# Patient Record
Sex: Female | Born: 1940 | Hispanic: Refuse to answer | Smoking: Never smoker
Health system: Southern US, Community
[De-identification: ages and names within clinical notes are randomized; demographics above are authoritative.]

## PROBLEM LIST (undated history)

## (undated) ENCOUNTER — Ambulatory Visit (HOSPITAL_COMMUNITY): Discharge status: Absent without leave | Payer: Medicare Other

## (undated) DIAGNOSIS — M8580 Other specified disorders of bone density and structure, unspecified site: Secondary | ICD-10-CM

## (undated) DIAGNOSIS — H409 Unspecified glaucoma: Secondary | ICD-10-CM

## (undated) DIAGNOSIS — T7840XA Allergy, unspecified, initial encounter: Secondary | ICD-10-CM

## (undated) DIAGNOSIS — R911 Solitary pulmonary nodule: Secondary | ICD-10-CM

## (undated) DIAGNOSIS — I82409 Acute embolism and thrombosis of unspecified deep veins of unspecified lower extremity: Secondary | ICD-10-CM

## (undated) DIAGNOSIS — L719 Rosacea, unspecified: Secondary | ICD-10-CM

## (undated) DIAGNOSIS — H269 Unspecified cataract: Secondary | ICD-10-CM

## (undated) DIAGNOSIS — A0471 Enterocolitis due to Clostridium difficile, recurrent: Secondary | ICD-10-CM

## (undated) DIAGNOSIS — H04123 Dry eye syndrome of bilateral lacrimal glands: Secondary | ICD-10-CM

## (undated) DIAGNOSIS — I700 Atherosclerosis of aorta: Secondary | ICD-10-CM

## (undated) DIAGNOSIS — A0472 Enterocolitis due to Clostridium difficile, not specified as recurrent: Secondary | ICD-10-CM

## (undated) HISTORY — DX: Other specified disorders of bone density and structure, unspecified site: M85.80

## (undated) HISTORY — DX: Enterocolitis due to Clostridium difficile, recurrent: A04.71

## (undated) HISTORY — DX: Unspecified glaucoma: H40.9

## (undated) HISTORY — DX: Solitary pulmonary nodule: R91.1

## (undated) HISTORY — DX: Atherosclerosis of aorta: I70.0

## (undated) HISTORY — DX: Unspecified cataract: H26.9

## (undated) HISTORY — PX: CATARACT EXTRACTION, BILATERAL: SHX1313

## (undated) HISTORY — PX: LYMPH NODE BIOPSY: SHX201

## (undated) HISTORY — DX: Allergy, unspecified, initial encounter: T78.40XA

## (undated) HISTORY — PX: OTHER SURGICAL HISTORY: SHX169

## (undated) HISTORY — PX: TUBAL LIGATION: SHX77

## (undated) HISTORY — DX: Dry eye syndrome of bilateral lacrimal glands: H04.123

---

## 1999-02-08 ENCOUNTER — Encounter: Admission: RE | Admit: 1999-02-08 | Discharge: 1999-02-08 | Payer: Self-pay | Admitting: Obstetrics and Gynecology

## 1999-02-08 ENCOUNTER — Encounter: Payer: Self-pay | Admitting: Obstetrics and Gynecology

## 2000-03-17 ENCOUNTER — Encounter: Admission: RE | Admit: 2000-03-17 | Discharge: 2000-03-17 | Payer: Self-pay | Admitting: Obstetrics and Gynecology

## 2000-03-17 ENCOUNTER — Encounter: Payer: Self-pay | Admitting: Obstetrics and Gynecology

## 2001-08-24 ENCOUNTER — Encounter: Admission: RE | Admit: 2001-08-24 | Discharge: 2001-08-24 | Payer: Self-pay | Admitting: Obstetrics and Gynecology

## 2001-08-24 ENCOUNTER — Encounter: Payer: Self-pay | Admitting: Obstetrics and Gynecology

## 2002-11-12 ENCOUNTER — Encounter: Payer: Self-pay | Admitting: Orthopedic Surgery

## 2002-11-12 ENCOUNTER — Encounter: Admission: RE | Admit: 2002-11-12 | Discharge: 2002-11-12 | Payer: Self-pay | Admitting: Orthopedic Surgery

## 2002-12-01 ENCOUNTER — Encounter: Admission: RE | Admit: 2002-12-01 | Discharge: 2002-12-01 | Payer: Self-pay | Admitting: Obstetrics and Gynecology

## 2002-12-01 ENCOUNTER — Encounter: Payer: Self-pay | Admitting: Obstetrics and Gynecology

## 2004-03-15 ENCOUNTER — Ambulatory Visit (HOSPITAL_COMMUNITY): Admission: RE | Admit: 2004-03-15 | Discharge: 2004-03-15 | Payer: Self-pay | Admitting: Obstetrics and Gynecology

## 2005-05-23 ENCOUNTER — Encounter: Admission: RE | Admit: 2005-05-23 | Discharge: 2005-05-23 | Payer: Self-pay | Admitting: Obstetrics and Gynecology

## 2005-08-15 ENCOUNTER — Encounter: Admission: RE | Admit: 2005-08-15 | Discharge: 2005-08-15 | Payer: Self-pay | Admitting: Orthopedic Surgery

## 2006-09-10 ENCOUNTER — Encounter: Admission: RE | Admit: 2006-09-10 | Discharge: 2006-09-10 | Payer: Self-pay | Admitting: Obstetrics and Gynecology

## 2007-09-11 ENCOUNTER — Encounter: Admission: RE | Admit: 2007-09-11 | Discharge: 2007-09-11 | Payer: Self-pay | Admitting: Obstetrics and Gynecology

## 2008-09-14 ENCOUNTER — Encounter: Admission: RE | Admit: 2008-09-14 | Discharge: 2008-09-14 | Payer: Self-pay | Admitting: Obstetrics and Gynecology

## 2009-09-21 ENCOUNTER — Encounter: Admission: RE | Admit: 2009-09-21 | Discharge: 2009-09-21 | Payer: Self-pay | Admitting: Obstetrics and Gynecology

## 2010-09-04 ENCOUNTER — Other Ambulatory Visit: Payer: Self-pay | Admitting: Obstetrics and Gynecology

## 2010-09-04 DIAGNOSIS — Z1231 Encounter for screening mammogram for malignant neoplasm of breast: Secondary | ICD-10-CM

## 2010-09-25 ENCOUNTER — Ambulatory Visit
Admission: RE | Admit: 2010-09-25 | Discharge: 2010-09-25 | Disposition: A | Discharge status: Home / Self Care | Payer: Medicare Other | Source: Ambulatory Visit | Attending: Obstetrics and Gynecology | Admitting: Obstetrics and Gynecology

## 2010-09-25 DIAGNOSIS — Z1231 Encounter for screening mammogram for malignant neoplasm of breast: Secondary | ICD-10-CM

## 2011-04-02 HISTORY — PX: EYE SURGERY: SHX253

## 2011-09-05 ENCOUNTER — Other Ambulatory Visit: Payer: Self-pay | Admitting: Obstetrics and Gynecology

## 2011-09-05 DIAGNOSIS — Z1231 Encounter for screening mammogram for malignant neoplasm of breast: Secondary | ICD-10-CM

## 2011-09-26 ENCOUNTER — Ambulatory Visit
Admission: RE | Admit: 2011-09-26 | Discharge: 2011-09-26 | Disposition: A | Discharge status: Home / Self Care | Payer: BLUE CROSS/BLUE SHIELD | Source: Ambulatory Visit | Attending: Obstetrics and Gynecology | Admitting: Obstetrics and Gynecology

## 2011-09-26 DIAGNOSIS — Z1231 Encounter for screening mammogram for malignant neoplasm of breast: Secondary | ICD-10-CM

## 2012-08-27 ENCOUNTER — Other Ambulatory Visit: Payer: Self-pay

## 2012-08-27 DIAGNOSIS — Z1231 Encounter for screening mammogram for malignant neoplasm of breast: Secondary | ICD-10-CM

## 2012-09-30 ENCOUNTER — Ambulatory Visit
Admission: RE | Admit: 2012-09-30 | Discharge: 2012-09-30 | Disposition: A | Discharge status: Home / Self Care | Payer: BLUE CROSS/BLUE SHIELD | Source: Ambulatory Visit

## 2012-09-30 DIAGNOSIS — Z1231 Encounter for screening mammogram for malignant neoplasm of breast: Secondary | ICD-10-CM

## 2013-09-09 ENCOUNTER — Other Ambulatory Visit: Payer: Self-pay

## 2013-09-09 DIAGNOSIS — Z1231 Encounter for screening mammogram for malignant neoplasm of breast: Secondary | ICD-10-CM

## 2013-10-14 ENCOUNTER — Encounter (INDEPENDENT_AMBULATORY_CARE_PROVIDER_SITE_OTHER): Payer: Self-pay

## 2013-10-14 ENCOUNTER — Ambulatory Visit
Admission: RE | Admit: 2013-10-14 | Discharge: 2013-10-14 | Disposition: A | Discharge status: Home / Self Care | Payer: Medicare Other | Source: Ambulatory Visit

## 2013-10-14 DIAGNOSIS — Z1231 Encounter for screening mammogram for malignant neoplasm of breast: Secondary | ICD-10-CM

## 2014-04-01 HISTORY — PX: CATARACT EXTRACTION: SUR2

## 2014-09-12 ENCOUNTER — Other Ambulatory Visit: Payer: Self-pay

## 2014-09-12 DIAGNOSIS — Z1231 Encounter for screening mammogram for malignant neoplasm of breast: Secondary | ICD-10-CM

## 2014-10-21 ENCOUNTER — Ambulatory Visit
Admission: RE | Admit: 2014-10-21 | Discharge: 2014-10-21 | Disposition: A | Discharge status: Home / Self Care | Payer: Medicare Other | Source: Ambulatory Visit

## 2014-10-21 DIAGNOSIS — Z1231 Encounter for screening mammogram for malignant neoplasm of breast: Secondary | ICD-10-CM

## 2015-09-13 ENCOUNTER — Encounter (HOSPITAL_COMMUNITY): Payer: Self-pay | Admitting: Emergency Medicine

## 2015-09-13 ENCOUNTER — Ambulatory Visit (HOSPITAL_COMMUNITY)
Admission: EM | Admit: 2015-09-13 | Discharge: 2015-09-13 | Disposition: A | Discharge status: Home / Self Care | Payer: Medicare Other | Attending: Emergency Medicine | Admitting: Emergency Medicine

## 2015-09-13 DIAGNOSIS — A09 Infectious gastroenteritis and colitis, unspecified: Secondary | ICD-10-CM | POA: Diagnosis not present

## 2015-09-13 DIAGNOSIS — R197 Diarrhea, unspecified: Secondary | ICD-10-CM

## 2015-09-13 HISTORY — DX: Rosacea, unspecified: L71.9

## 2015-09-13 LAB — C DIFFICILE QUICK SCREEN W PCR REFLEX
C Diff antigen: POSITIVE — AB
C Diff interpretation: POSITIVE
C Diff toxin: POSITIVE — AB

## 2015-09-13 MED ORDER — METRONIDAZOLE 500 MG PO TABS: 500.0000 mg | ORAL_TABLET | Freq: Three times a day (TID) | ORAL | Status: DC

## 2015-09-13 NOTE — Discharge Instructions (Signed)
We will send your stool for testing. We will call you with the results. You have enough risk factors that I'm going to start you on medication for C. Difficile. Take Flagyl 3 times a day for 10 days. If you develop symptoms of a yeast infection, please call our office and I will call in a prescription for you. Follow-up as needed.

## 2015-09-13 NOTE — ED Provider Notes (Addendum)
CSN: [id number]     Arrival date & time 09/13/15  1611 History   First MD Initiated Contact with Patient 09/13/15 1729     Chief Complaint  Patient presents with  . Diarrhea   (Consider location/radiation/quality/duration/timing/severity/associated sxs/prior Treatment) HPI She is a 75 year old woman here for evaluation of diarrhea. This started 2 days ago with frequent watery bowel movements. This is associated with abdominal cramping prior to the bowel movements. She denies any nausea or vomiting. She does report having a good appetite, but anytime she eats or drinks anything it triggers a bout of diarrhea. Diarrhea is nonbloody and foul-smelling. She reports feeling achy and feverish on the first day, but not since then. She completed a course of clindamycin approximately 10 days ago. She is concerned she has C. difficile.  Past Medical History  Diagnosis Date  . Rosacea    Past Surgical History  Procedure Laterality Date  . Lymphnode biopsy     History reviewed. No pertinent family history. Social History  Substance Use Topics  . Smoking status: Never Smoker   . Smokeless tobacco: None  . Alcohol Use: No   OB History    No data available     Review of Systems As in history of present illness Allergies  Penicillins  Home Medications   Prior to Admission medications   Medication Sig Start Date End Date Taking? Authorizing Provider  metroNIDAZOLE (FLAGYL) 500 MG tablet Take 1 tablet (500 mg total) by mouth 3 (three) times daily. 09/13/15   Charm RingsErin J Demetrick Eichenberger, MD   Meds Ordered and Administered this Visit  Medications - No data to display  BP 150/74 mmHg  Pulse 75  Temp(Src) 98.4 F (36.9 C) (Oral)  SpO2 100% No data found.   Physical Exam  Constitutional: She is oriented to person, place, and time. She appears well-developed and well-nourished.  HENT:  Mouth/Throat: Oropharynx is clear and moist.  Neck: Neck supple.  Cardiovascular: Normal rate and regular rhythm.    No murmur heard. Pulmonary/Chest: Effort normal and breath sounds normal. No respiratory distress. She has no wheezes.  Abdominal: She exhibits no distension. There is no tenderness. There is no rebound and no guarding.  Increased bowel sounds  Neurological: She is alert and oriented to person, place, and time.    ED Course  Procedures (including critical care time)  Labs Review Labs Reviewed  C DIFFICILE QUICK SCREEN W PCR REFLEX    Imaging Review No results found.    MDM   1. Diarrhea of presumed infectious origin    Stool sample sent for C. difficile testing. Given her risk factors, will start on Flagyl 3 times a day. Follow-up as needed.    Charm RingsErin J Jacqualyn Sedgwick, MD 09/13/15 1827  Called and spoke with patient.  C diff is positive.  She has picked up the Flagyl and will start it tonight.  Also recommended daily yogurt or probiotic. F/u if symptoms worsen or come back.  All questions were answered.  Charm RingsErin J Wildon Cuevas, MD 09/13/15 2120

## 2015-09-13 NOTE — ED Notes (Signed)
[facility] a [facility] diarrhea x 2 days.

## 2015-09-30 ENCOUNTER — Ambulatory Visit (HOSPITAL_COMMUNITY)
Admission: EM | Admit: 2015-09-30 | Discharge: 2015-09-30 | Disposition: A | Discharge status: Home / Self Care | Payer: Medicare Other | Attending: Emergency Medicine | Admitting: Emergency Medicine

## 2015-09-30 ENCOUNTER — Encounter (HOSPITAL_COMMUNITY): Payer: Self-pay | Admitting: *Deleted

## 2015-09-30 DIAGNOSIS — A09 Infectious gastroenteritis and colitis, unspecified: Secondary | ICD-10-CM | POA: Diagnosis not present

## 2015-09-30 DIAGNOSIS — Z8619 Personal history of other infectious and parasitic diseases: Secondary | ICD-10-CM

## 2015-09-30 DIAGNOSIS — R197 Diarrhea, unspecified: Secondary | ICD-10-CM

## 2015-09-30 MED ORDER — VANCOMYCIN HCL 125 MG PO CAPS: ORAL_CAPSULE | ORAL | Status: DC

## 2015-09-30 NOTE — ED Notes (Signed)
Pt was seen 6/14 and treated with Flagyl for C-diff.  Started improving; finished last dose 7 days ago.  Yesterday morning woke up with symptoms worsening again - malodorous diarrhea with mucus and not feeling well.  Denies fevers.

## 2015-09-30 NOTE — ED Provider Notes (Signed)
CSN: [id number]     Arrival date & time 09/30/15  1213 History   First MD Initiated Contact with Patient 09/30/15 1249     Chief Complaint  Patient presents with  . Diarrhea   (Consider location/radiation/quality/duration/timing/severity/associated sxs/prior Treatment) HPI  She is a 75 year old woman here for evaluation of recurrent diarrhea. She was seen here on June 14 and found to have C. difficile diarrhea. She was treated with a 10 day course of Flagyl. She states her symptoms did resolve after a few days on the Flagyl. However, yesterday, her symptoms recurred. She has had multiple episodes of watery foul-smelling diarrhea. She also reports some abdominal discomfort with the diarrhea.  No fevers or vomiting. She is still able to tolerate liquids.  She did bring a stool sample with her today.  Past Medical History  Diagnosis Date  . Rosacea    Past Surgical History  Procedure Laterality Date  . Lymphnode biopsy     No family history on file. Social History  Substance Use Topics  . Smoking status: Never Smoker   . Smokeless tobacco: None  . Alcohol Use: No   OB History    No data available     Review of Systems As in history of present illness Allergies  Penicillins  Home Medications   Prior to Admission medications   Medication Sig Start Date End Date Taking? Authorizing Provider  Cholecalciferol (VITAMIN D PO) Take by mouth.   Yes Historical Provider, MD  UNKNOWN TO PATIENT Med for rosacea   Yes Historical Provider, MD  vancomycin (VANCOCIN) 125 MG capsule Take 1 capsule 4 times a day for 2 weeks, then twice a day for 1 week, then once a day for 1 week 09/30/15   Charm RingsErin J Honig, MD   Meds Ordered and Administered this Visit  Medications - No data to display  BP 139/66 mmHg  Pulse 74  Temp(Src) 97.9 F (36.6 C) (Oral)  Resp 20  Ht 5\' 3"  (1.6 m)  Wt 185 lb (83.915 kg)  BMI 32.78 kg/m2  SpO2 99% No data found.   Physical Exam  Constitutional: She is oriented  to person, place, and time. She appears well-developed and well-nourished. No distress.  Cardiovascular: Normal rate.   Pulmonary/Chest: Effort normal.  Neurological: She is alert and oriented to person, place, and time.    ED Course  Procedures (including critical care time)  Labs Review Labs Reviewed  C DIFFICILE QUICK SCREEN W PCR REFLEX    Imaging Review No results found.   MDM   1. Diarrhea of presumed infectious origin   2. H/O Clostridium difficile infection    We'll send stool for testing. 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 We'll treat with oral vancomycin with a taper. Follow-up as needed.    Charm RingsErin J Honig, MD 09/30/15 [phone]

## 2015-09-30 NOTE — Discharge Instructions (Signed)
I will call you with the results of your C. difficile test tonight or tomorrow. In the meantime, start vancomycin as prescribed. We are going to do a tapered dose. If the vancomycin is going be very expensive, please have the pharmacist call me to see what I can do. Please continue a daily probiotic. Culturelle is a great choice. Do not use any antidiarrheal medicine. Follow-up as needed.

## 2015-10-16 ENCOUNTER — Other Ambulatory Visit: Payer: Self-pay | Admitting: Obstetrics & Gynecology

## 2015-10-16 DIAGNOSIS — Z1231 Encounter for screening mammogram for malignant neoplasm of breast: Secondary | ICD-10-CM

## 2015-11-05 ENCOUNTER — Encounter (HOSPITAL_COMMUNITY): Payer: Self-pay | Admitting: *Deleted

## 2015-11-05 ENCOUNTER — Ambulatory Visit (HOSPITAL_COMMUNITY)
Admission: EM | Admit: 2015-11-05 | Discharge: 2015-11-05 | Disposition: A | Discharge status: Home / Self Care | Payer: Medicare Other | Attending: Emergency Medicine | Admitting: Emergency Medicine

## 2015-11-05 DIAGNOSIS — A047 Enterocolitis due to Clostridium difficile: Secondary | ICD-10-CM | POA: Diagnosis not present

## 2015-11-05 DIAGNOSIS — A0472 Enterocolitis due to Clostridium difficile, not specified as recurrent: Secondary | ICD-10-CM

## 2015-11-05 HISTORY — DX: Enterocolitis due to Clostridium difficile, not specified as recurrent: A04.72

## 2015-11-05 MED ORDER — VANCOMYCIN HCL 125 MG PO CAPS: ORAL_CAPSULE | ORAL | 0 refills | Status: DC

## 2015-11-05 NOTE — Discharge Instructions (Signed)
Continue to push fluids

## 2015-11-05 NOTE — ED Provider Notes (Signed)
CSN: [id number]     Arrival date & time 11/05/15  1402 History   None    Chief Complaint  Patient presents with  . Diarrhea   (Consider location/radiation/quality/duration/timing/severity/associated sxs/prior Treatment) Patient presents with for evaluation of recurrent diarrhea. She was seen here on June 14 and July 1 and found to have C. difficile diarrhea. She was treated with a 10 day course of Flagyl. She states her symptoms did resolve with flagyl and with the second treatment of vancomycin However her symptoms recurred with 6 episodes of watery foul-smelling diarrhea. She also reports some abdominal discomfort with the diarrhea.  No fevers or vomiting. She is still able to tolerate liquids. Patient is requesting follow up with Infectious diseases. Dr. Ivar Drape Snider      Past Medical History:  Diagnosis Date  . C. difficile diarrhea   . Rosacea    Past Surgical History:  Procedure Laterality Date  . lymphnode biopsy     History reviewed. No pertinent family history. Social History  Substance Use Topics  . Smoking status: Never Smoker  . Smokeless tobacco: Never Used  . Alcohol use No   OB History    No data available     Review of Systems  Gastrointestinal: Positive for abdominal distention, abdominal pain and diarrhea.    Allergies  Penicillins  Home Medications   Prior to Admission medications   Medication Sig Start Date End Date Taking? Authorizing Provider  Cholecalciferol (VITAMIN D PO) Take by mouth.    Historical Provider, MD  UNKNOWN TO PATIENT Med for rosacea    Historical Provider, MD  vancomycin (VANCOCIN) 125 MG capsule Take 1 capsule 4 times a day for 2 weeks, then twice a day for 1 week, then once a day for 1 week 11/05/15   Alene MiresJennifer C Vinie Charity, NP   Meds Ordered and Administered this Visit  Medications - No data to display  BP 127/82 (BP Location: Left Arm)   Pulse 86   Temp 98.1 F (36.7 C) (Oral)   SpO2 97%  No data found.   Physical Exam   Constitutional: She is oriented to person, place, and time. She appears well-developed and well-nourished.  Cardiovascular: Normal rate and regular rhythm.   Abdominal: Soft. There is tenderness.  Hyperactive bowel sounds in all 4 quadrants  Neurological: She is alert and oriented to person, place, and time.    Urgent Care Course   Clinical Course    Procedures (including critical care time)  Labs Review Labs Reviewed - No data to display  Imaging Review No results found.   Visual Acuity Review  Right Eye Distance:   Left Eye Distance:   Bilateral Distance:    Right Eye Near:   Left Eye Near:    Bilateral Near:         MDM   1. C. difficile diarrhea        Alene MiresJennifer C Niketa Turner, NP 11/05/15 [phone]

## 2015-11-05 NOTE — ED Notes (Signed)
Pt   Brought   A  Formed  Stool from  Home   Which  Does  Not meet the  Requirements   For a  Stool  c  Diff

## 2015-11-06 ENCOUNTER — Telehealth (HOSPITAL_COMMUNITY): Payer: Self-pay | Admitting: Emergency Medicine

## 2015-11-06 NOTE — Telephone Encounter (Signed)
Patient has had several episodes of recurrent c diff.  First episode June 14.  Treated with Flagyl with resolution of symptoms, but then diarrhea recurred.  She was seen again July 1 and treated with PO vanc.  Again, her symptoms resolved, but [facility] watery [facility].  Referral placed to Infectious Disease in [facility].  Patient requests f/u with Dr. Drue SecondSnider as she had previously cared for the patient's husband.  Documented c diff on 6/14 only.

## 2015-11-07 ENCOUNTER — Ambulatory Visit
Admission: RE | Admit: 2015-11-07 | Discharge: 2015-11-07 | Disposition: A | Discharge status: Home / Self Care | Payer: Medicare Other | Source: Ambulatory Visit | Attending: Obstetrics & Gynecology | Admitting: Obstetrics & Gynecology

## 2015-11-07 DIAGNOSIS — Z1231 Encounter for screening mammogram for malignant neoplasm of breast: Secondary | ICD-10-CM

## 2015-11-07 NOTE — Telephone Encounter (Signed)
We will see her back. Do you mind retesting c.diff pcr on her to see that this is a relapse. If positive. Would treat with  po QID, oral vanco x 2 wk, then  TID x 1 wk,  BID x 1 wk,  daily.

## 2015-11-13 ENCOUNTER — Ambulatory Visit
Admission: RE | Admit: 2015-11-13 | Discharge: 2015-11-13 | Disposition: A | Discharge status: Home / Self Care | Payer: Medicare Other | Source: Ambulatory Visit | Attending: Internal Medicine | Admitting: Internal Medicine

## 2015-11-13 ENCOUNTER — Encounter: Payer: Self-pay | Admitting: *Deleted

## 2015-11-13 ENCOUNTER — Ambulatory Visit (INDEPENDENT_AMBULATORY_CARE_PROVIDER_SITE_OTHER): Payer: Medicare Other | Admitting: Internal Medicine

## 2015-11-13 VITALS — BP 119/80 | HR 78 | Ht 63.0 in | Wt 189.0 lb

## 2015-11-13 DIAGNOSIS — R1909 Other intra-abdominal and pelvic swelling, mass and lump: Secondary | ICD-10-CM

## 2015-11-13 DIAGNOSIS — A0471 Enterocolitis due to Clostridium difficile, recurrent: Secondary | ICD-10-CM

## 2015-11-13 DIAGNOSIS — R1900 Intra-abdominal and pelvic swelling, mass and lump, unspecified site: Secondary | ICD-10-CM

## 2015-11-13 DIAGNOSIS — A047 Enterocolitis due to Clostridium difficile: Secondary | ICD-10-CM | POA: Diagnosis present

## 2015-11-13 HISTORY — DX: Enterocolitis due to Clostridium difficile, recurrent: A04.71

## 2015-11-13 NOTE — Progress Notes (Signed)
RFV: new patient for management of refractory cdifficile  Patient ID: Monica Lamb, female   DOB: 01/24/1941, 75 y.o.   MRN: [id number]  HPI  Monica Lamb is a 75yo F. with rosacea, vitamin deficiency, but overall in goo state of health until recently. In May 23-June 2nd 10d course of clindamycin for dental abscess. Had not been on abtx for several years.   She started to have diarrhea started on June 12th, and went to the [facility] center, tested on 6/14 which was positive. Watery and frequent , 15 BMs per day.lsot #5 lb at that course First given 10 day of flagyl. She felt that she recovered, had normal bowel movements by the end of the treatment  She noticed having recurrent watery, foul smelling. Then in beginning of July, [facility] and given now oral vancomycin with a taper. She noticed getting noticeably better with less watery diarrhea, but some mucousy stool even after 7 days of treatment.  She takes probiotics with her regimen. She had finished her taper on July 28th but then started to have relapse of symptoms   She returned to 8/6 for recurrent symptoms, concerning for relapse. cdifficile.  She was reinitiated treatment but was not retested.   Now, her stool is formed, now has bloating, urgency. Now has 4-5 x per day. No blood in stool. No longer has cramping (initially),   Normal routine - bowel movement after coffee, and then a second bowel movement after she eats.  All: penicillin, had rash as an adult   Outpatient Encounter Prescriptions as of 11/13/2015  Medication Sig  . Cholecalciferol (VITAMIN D PO) Take 1,000 mg by mouth daily.   . metroNIDAZOLE (METROCREAM) 0.75 % cream Apply 1 application topically daily. For rosacea  . vancomycin (VANCOCIN) 125 MG capsule Take 1 capsule 4 times a day for 2 weeks, then twice a day for 1 week, then once a day for 1 week  . [DISCONTINUED] UNKNOWN TO PATIENT Med for rosacea   No facility-administered encounter medications on  file as of 11/13/2015.      There are no active problems to display for this patient.    Health Maintenance Due  Topic Date Due  . TETANUS/TDAP  11/05/1961  . COLONOSCOPY  11/05/1992  . ZOSTAVAX  11/06/2002  . DEXA SCAN  11/06/2007  . PNA vac Low Risk Adult (1 of 2 - PCV13) 11/06/2007  . INFLUENZA VACCINE  10/31/2015    - cervical polyp removal August 2017 - rib lesion noticeable bulge (left) June 2017 - to be seen on august 22nd 2017  Social hx:  Social History   Social History  . Marital status: Widowed    Spouse name: N/A  . Number of children: N/A  . Years of education: N/A   Occupational History  . Not on file.   Social History Main Topics  . Smoking status: Never Smoker  . Smokeless tobacco: Never Used  . Alcohol use No  . Drug use: No  . Sexual activity: Not on file   Other Topics Concern  . Not on file   Social History Narrative  . No narrative on file  - lost her husband, chronically ill, in end of may 2017  Family hx: lung cancer (sister, father). No colon cancer. Breat cancer (her aunt)  Review of Systems Review of Systems  Constitutional: Negative for fever, chills, diaphoresis, activity change, appetite change, fatigue and unexpected weight change.  HENT: Negative for congestion, sore throat, rhinorrhea, sneezing,  trouble swallowing and sinus pressure.  Eyes: Negative for photophobia and visual disturbance.  Respiratory: Negative for cough, chest tightness, shortness of breath, wheezing and stridor.  Cardiovascular: Negative for chest pain, palpitations and leg swelling.  Gastrointestinal: Negative for nausea, vomiting, abdominal pain, diarrhea, constipation, blood in stool, abdominal distention and anal bleeding.  Genitourinary: Negative for dysuria, hematuria, flank pain and difficulty urinating.  Musculoskeletal: Negative for myalgias, back pain, joint swelling, arthralgias and gait problem.  Skin: Negative for color change, pallor, rash and  wound.  Neurological: Negative for dizziness, tremors, weakness and light-headedness.  Hematological: Negative for adenopathy. Does not bruise/bleed easily.  Psychiatric/Behavioral: Negative for behavioral problems, confusion, sleep disturbance, dysphoric mood, decreased concentration and agitation.    Physical Exam   Ht 5\' 3"  (1.6 m)   Wt 189 lb (85.7 kg)   BMI 33.48 kg/m  Physical Exam  Constitutional:  oriented to person, place, and time. appears well-developed and well-nourished. No distress.  HENT: [place]/AT, PERRLA, no scleral icterus Mouth/Throat: Oropharynx is clear and moist. No oropharyngeal exudate.  Cardiovascular: Normal rate, regular rhythm and normal heart sounds. Exam reveals no gallop and no friction rub.  No murmur heard.  Pulmonary/Chest: Effort normal and breath sounds normal. No respiratory distress.  has no wheezes.  Neck = supple, no nuchal rigidity Abdominal: Soft. Bowel sounds are normal.  exhibits no distension. There is 6cm by 3.5cm fullness eliptical shaped. Somewhat firm ? Lipoma, moveable, not fluctuant, no erythema Lymphadenopathy: no cervical adenopathy. No axillary adenopathy Neurological: alert and oriented to person, place, and time.  Skin: Skin is warm and dry. No rash noted. No erythema.  Psychiatric: a normal mood and affect.  behavior is normal.   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 First was protracted. If recurrent, we will give move forth with FMT. For now will continue with vancomycin taper. She will start TID dosing x 7 days on 20th. Then bid x 7day, then qday x 7day. Then will do 125mg   Q3d x 2 wk. May need refill. We spent 40 min explaining process of FMT, IRB process, stool procurement  Left chest/abdominal wall lesion = fullness to end of rib cage. Will get abd cxr today to see if attached to rib though on exam doesn't seem like it on exam. Will move  forth with abd CT. She has appt with dr. Carolynne Edouardtoth on aug 22nd  Will get her a new pcp since currently getting care through urgent care. She is quite pleased with care but thought it would be good to have consistency through geritrician  Health maintenance = she has not had any colon cancer screening in the past. Will need to get colonoscopy this year.  Cervical polyp = removed last week, benign. Followed by gynecology

## 2015-11-14 ENCOUNTER — Telehealth: Payer: Self-pay | Admitting: General Practice

## 2015-11-14 NOTE — Telephone Encounter (Signed)
Opened in eror

## 2015-11-15 ENCOUNTER — Telehealth: Payer: Self-pay | Admitting: *Deleted

## 2015-11-15 NOTE — Addendum Note (Signed)
Addended by: Andree CossHOWELL, MICHELLE M on: 11/15/2015 09:30 AM   Modules accepted: Orders

## 2015-11-15 NOTE — Telephone Encounter (Signed)
Patient called, asked for results of her xray and wanted to know about the abdominal ct, stated Dr Drue SecondSnider preferred to have this complete before she sees the surgeon on 8/22.   RN spoke with insurance - no prior authorization requested. [facility] - scheduled patient for 8/21 at 3:00 (arrive 2:30 for labs). Patient declined appointment due to the eclipse and potential traffic concerns. RN gave [facility] the [facility] to reschedule according to her schedule. Andree CossHowell, Marolyn Urschel M, RN

## 2015-11-16 NOTE — Telephone Encounter (Signed)
She rescheduled the CT to 8/24.  She was able to get an appointment with Dr. Kriste BasqueHannah Kim at [facility] to establish PCP care on 8/28.

## 2015-11-16 NOTE — Telephone Encounter (Signed)
Her xray did not show any of the soft tissue mass so that is why we are going to get a ct. Thank you for elaborating on reasons why she declined the appt. Please have her reschedule. thx

## 2015-11-20 ENCOUNTER — Other Ambulatory Visit: Payer: Medicare Other

## 2015-11-23 ENCOUNTER — Other Ambulatory Visit: Payer: Self-pay | Admitting: *Deleted

## 2015-11-23 ENCOUNTER — Ambulatory Visit
Admission: RE | Admit: 2015-11-23 | Discharge: 2015-11-23 | Disposition: A | Discharge status: Home / Self Care | Payer: Medicare Other | Source: Ambulatory Visit | Attending: Internal Medicine | Admitting: Internal Medicine

## 2015-11-23 DIAGNOSIS — R1909 Other intra-abdominal and pelvic swelling, mass and lump: Secondary | ICD-10-CM

## 2015-11-23 DIAGNOSIS — R1900 Intra-abdominal and pelvic swelling, mass and lump, unspecified site: Secondary | ICD-10-CM

## 2015-11-23 DIAGNOSIS — A0471 Enterocolitis due to Clostridium difficile, recurrent: Secondary | ICD-10-CM

## 2015-11-23 MED ORDER — IOPAMIDOL (ISOVUE-300) INJECTION 61%
100.0000 mL | Freq: Once | INTRAVENOUS | Status: AC | PRN
  Administered 2015-11-23: 100 mL via INTRAVENOUS

## 2015-11-23 MED ORDER — VANCOMYCIN HCL 125 MG PO CAPS: ORAL_CAPSULE | ORAL | 0 refills | Status: DC

## 2015-11-23 NOTE — Progress Notes (Signed)
At office visit, Dr. Drue SecondSnider altered patient's vancomycin taper and offered for [facility] to call in any refill that she would need to take the medication with the new directions.  Patient already had a prescription at home, began taking it as prescribed by Dr. Drue SecondSnider.   Patient called 8/23 to let us know that she would need a new rx to complete the taper.  RN confirmed how the patient was taking medications, called in a prescription to [facility] to cover the last 4 weeks. Andree CossHowell, Fantasia Jinkins M, RN

## 2015-11-27 ENCOUNTER — Ambulatory Visit (INDEPENDENT_AMBULATORY_CARE_PROVIDER_SITE_OTHER): Payer: Medicare Other | Admitting: Family Medicine

## 2015-11-27 ENCOUNTER — Encounter: Payer: Self-pay | Admitting: Family Medicine

## 2015-11-27 VITALS — HR 85 | Temp 97.7°F | Ht 62.0 in | Wt 189.2 lb

## 2015-11-27 DIAGNOSIS — Z23 Encounter for immunization: Secondary | ICD-10-CM

## 2015-11-27 DIAGNOSIS — A047 Enterocolitis due to Clostridium difficile: Secondary | ICD-10-CM | POA: Diagnosis not present

## 2015-11-27 DIAGNOSIS — M8580 Other specified disorders of bone density and structure, unspecified site: Secondary | ICD-10-CM | POA: Diagnosis not present

## 2015-11-27 DIAGNOSIS — Z6834 Body mass index (BMI) 34.0-34.9, adult: Secondary | ICD-10-CM | POA: Insufficient documentation

## 2015-11-27 DIAGNOSIS — Z7189 Other specified counseling: Secondary | ICD-10-CM

## 2015-11-27 DIAGNOSIS — A0471 Enterocolitis due to Clostridium difficile, recurrent: Secondary | ICD-10-CM

## 2015-11-27 DIAGNOSIS — L719 Rosacea, unspecified: Secondary | ICD-10-CM

## 2015-11-27 DIAGNOSIS — R1900 Intra-abdominal and pelvic swelling, mass and lump, unspecified site: Secondary | ICD-10-CM

## 2015-11-27 DIAGNOSIS — Z7689 Persons encountering health services in other specified circumstances: Secondary | ICD-10-CM

## 2015-11-27 NOTE — Patient Instructions (Addendum)
BEFORE YOU LEAVE: -prevnar 13 -follow up: 1) Medicare exam with Darl PikesSusan sometime in the next 3 months/flu shot that day 2) follow up with Dr. Selena BattenKim; come fasting and we will plan to do labs   We recommend the following healthy lifestyle: 1) Small portions - eat off of salad plate instead of dinner plate 2) Eat a healthy clean diet with avoidance of (less then 1 serving per week) processed foods, sweetened drinks, white starches, red meat, fast foods and sweets and consisting of: * 5-9 servings per day of fresh or frozen fruits and vegetables (not corn or potatoes, not dried or canned) *nuts and seeds, beans *olives and olive oil *small portions of lean meats such as fish and white chicken  *small portions of whole grains 3)Get at least 150 minutes of sweaty aerobic exercise per week 4)reduce stress - counseling, meditation, relaxation to balance other aspects of your life

## 2015-11-27 NOTE — Progress Notes (Signed)
Pre visit review using our clinic review tool, if applicable. No additional management support is needed unless otherwise documented below in the visit note. 

## 2015-11-27 NOTE — Progress Notes (Signed)
HPI:  Monica Lamb is here to establish care. Referral from Dr. Drue Lamb. Used to see Dr. Talmage Lamb, endocrinologist, for primary care. Still sees her for low Vit. D.  Last PCP and physical: sees gyn, Dr. Juliene Lamb  Has the following chronic problems that require follow up and concerns today:  Recurrent C. Diff Colitis: -started after clinda for dental abscess -seeing Dr. Drue Lamb in infectious disease -on extended vancomycin course -still has some gas and bloating -no fever, cramping, diarrhea -on culturelle  Lipoma: -L flank -saw surgeon, had CT and CXR  Dermatologist: -[place]: dermatology -gyn: Dr. Juliene Lamb  Hx Osteopenia: -on fosamax for 1-2 years -thinks last dexa about 4 years ago, declines repeat today  Due for colon cancer screening. Declines today. Agrees to discuss further at wellness visit.  ROS negative for unless reported above: fevers, unintentional weight loss, hearing or vision loss, chest pain, palpitations, struggling to breath, hemoptysis, melena, hematochezia, hematuria, falls, loc, si, thoughts of self harm  Past Medical History:  Diagnosis Date  . Allergy   . C. difficile colitis   . C. difficile diarrhea   . Glaucoma   . Osteopenia    took fosomax remotely for a few years  . Rosacea    -had surgery with Monica Lamb remortely; sees Dr. Dagoberto Lamb    Past Surgical History:  Procedure Laterality Date  . CATARACT EXTRACTION, BILATERAL    . LYMPH NODE BIOPSY     benign per patient  . lymphnode biopsy    . TUBAL LIGATION      Family History  Problem Relation Age of Onset  . Arthritis Mother   . Hypertension Mother   . Macular degeneration Mother   . Cancer Father   . Arthritis Sister   . Hypertension Sister   . Cancer Maternal Aunt     Social History   Social History  . Marital status: Widowed    Spouse name: N/A  . Number of children: N/A  . Years of education: N/A   Social History Main Topics  . Smoking status: Never Smoker  .  Smokeless tobacco: Never Used  . Alcohol use No  . Drug use: No  . Sexual activity: Not Asked   Other Topics Concern  . 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  Cholecalciferol (VITAMIN D PO), Take 1,000 mg by mouth daily. , Disp: , Rfl:  .  metroNIDAZOLE (METROCREAM) 0.75 % cream, Apply 1 application topically daily. For rosacea, Disp: , Rfl:  .  vancomycin (VANCOCIN) 125 MG capsule, Take 1 capsule 4 times a day for 2 weeks, then twice a day for 1 week, then once a day for 1 week, Disp: 77 capsule, Rfl: 0 .  vancomycin (VANCOCIN) 125 MG capsule, (continuation of previous taper) take 1 capsule by mouth twice daily for 7 days; then 1 capsule by mouth daily for 7 days; then 1 capsule by mouth every 3 days for 2 weeks., Disp: 27 capsule, Rfl: 0  EXAM:  Vitals:   11/27/15 1258  Pulse: 85  Temp: 97.7 F (36.5 C)    Body  mass index is 34.61 kg/m.  GENERAL: vitals reviewed and listed above, alert, oriented, appears well hydrated and in no acute distress  HEENT: atraumatic, conjunttiva clear, no obvious abnormalities on inspection of external nose and ears  NECK: no obvious masses on inspection  LUNGS: clear to auscultation bilaterally, no wheezes, rales or rhonchi, good air movement  CV: HRRR, no peripheral edema  MS: moves all extremities without noticeable abnormality  PSYCH: pleasant and cooperative, no obvious depression or anxiety  ASSESSMENT AND PLAN:  Discussed the following assessment and plan: More than 50% of over 30 minutes spent in total in caring for this patient was spent face-to-face with the patient, counseling and/or coordinating care.     Recurrent Clostridium difficile diarrhea -managed by [name], cont tx taper -probiotic -healthy diet and regular exercise  Abdominal wall mass of left flank -lipoma, she follows with surgeon  Osteopenia -advised repeat dexa, she declined today - she agreed to discuss furhter at medicare exam  Rosacea -sees dermatologist  Obesity: -lifestyle recs -labs at follow up  Encounter to establish care -We reviewed the PMH, PSH, FH, SH, Meds and Allergies. -We provided refills for any medications we will prescribe as needed. -We addressed current concerns per orders and patient instructions. -We have asked for records for pertinent exams, studies, vaccines and notes from previous providers. -We have advised patient to follow up per instructions below. -she agreed to prevnar 13 today -flu shot at wellness visit -she declined colon cancer screening, agreed to discuss further at wellness exam  -Patient advised to return or notify a doctor immediately if symptoms worsen or persist or new concerns arise.  Patient Instructions  BEFORE YOU LEAVE: -prevnar 13 -follow up: 1) Medicare exam with Monica Lamb sometime in the next 3 months/flu shot that day 2) follow up with Dr. Selena Lamb; come fasting and we will plan to do labs   We recommend the following healthy lifestyle: 1) Small portions - eat off of salad plate instead of dinner plate 2) Eat a healthy clean diet with avoidance of (less then 1 serving per week) processed foods, sweetened drinks, white starches, red meat, fast foods and sweets and consisting of: * 5-9 servings per day of fresh or frozen fruits and vegetables (not corn or potatoes, not dried or canned) *nuts and seeds, beans *olives and olive oil *small portions of lean meats such as fish and white chicken  *small portions of whole grains 3)Get at least 150 minutes of sweaty aerobic exercise per week 4)reduce stress - counseling, meditation, relaxation to balance other  aspects of your life          Monica Lamb.

## 2015-11-27 NOTE — Addendum Note (Signed)
Addended by: Johnella MoloneyFUNDERBURK, Ziare Orrick A on: 11/27/2015 02:04 PM   Modules accepted: Orders

## 2016-02-01 ENCOUNTER — Other Ambulatory Visit: Payer: Medicare Other

## 2016-02-01 ENCOUNTER — Telehealth: Payer: Self-pay | Admitting: *Deleted

## 2016-02-01 ENCOUNTER — Other Ambulatory Visit: Payer: Self-pay | Admitting: *Deleted

## 2016-02-01 DIAGNOSIS — R197 Diarrhea, unspecified: Secondary | ICD-10-CM

## 2016-02-01 NOTE — Telephone Encounter (Signed)
Patient dropped off her stool kit and Janyce Llanos, CMA put in the order. Darnelle Maffucci is also advising the patient that Dr. Baxter Flattery will call her regarding the results of her Boaz

## 2016-02-01 NOTE — Telephone Encounter (Signed)
Per Dr Drue SecondSnider order placed for stool and patient will be called later today about her CT

## 2016-02-01 NOTE — Telephone Encounter (Signed)
Patient called stating she is having frequent diarrhea again and was given a stool kit to bring to clinic if she was having symptoms. Advised will let Dr. Baxter Flattery know to order the necessary tests she wants done. She also was requesting an appt with MD and scheduled her for 02/05/16 (as there was an opening). She stated she has not heard back on the results of her CT and advised I will call her as soon as it is reviewed by the MD.  Myrtis Hopping

## 2016-02-02 ENCOUNTER — Ambulatory Visit (INDEPENDENT_AMBULATORY_CARE_PROVIDER_SITE_OTHER): Payer: Medicare Other

## 2016-02-02 VITALS — BP 140/80 | Ht 63.0 in | Wt 177.0 lb

## 2016-02-02 DIAGNOSIS — Z0000 Encounter for general adult medical examination without abnormal findings: Secondary | ICD-10-CM

## 2016-02-02 LAB — CLOSTRIDIUM DIFFICILE BY PCR: Toxigenic C. Difficile by PCR: DETECTED — CR

## 2016-02-02 NOTE — Progress Notes (Signed)
Subjective:   Monica BushmanSharon O Lamb is a 75 y.o. female who presents for Medicare Annual (Subsequent) preventive examination.  The Patient was informed that the wellness visit is to identify future health risk and educate and initiate measures that can reduce risk for increased disease through the lifespan.    NO ROS; Medicare Wellness Visit  Describes health as good, fair or great?  Spent 20 minutes talking about her hx and health which has not been as good with recent loss of spouse who had been ill x 20 plus years.    Recurring c-diff since June Developed after taking clindamyacin for root canal Now off vanco; will fup with Dr. Drue SecondSnider  Bowel statis has not returned to normal Will see Dr. 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 Elmyra RicksKim's visit but now states she may fup with DEXA States her  report in 2014 was good;  Stopped Fosamax  Put on her visit note to discuss repeat again with Dr. Selena BattenKim  Mammogram 10/2015 GYN; Dr. Mitzi HansenMoody   Current smoking/ tobacco status/ never smoked  Etoh; no   Meds reviewed; off vancomycin   RISK FACTORS Regular exercise;  Prior to illness this year; she [facility] [employer] [facility]; was just over there last month to resign back up- will make an apt when she feels better;   Diet Now still have bowel issues; off vanco since she end of sept Consistently passing mucous; some days it is worse She feels her diet is totally irregular Think she should work with a Scientific laboratory techniciandietician;  Takes probiotics;  Can't plan her day as she is afraid of diarrhea etc.  To see Dr. Drue SecondSnider next  week   Fall risk; no Mobility of Functional changes this year? Sick this past year but strong and does normally exercise  Safety; community, 2 level home wears sunscreen- doesn't go out in the sun much  safe place for firearms if they exist  Motor vehicle accidents; no   Cardiac Risk Factors:  Advanced aged >65 in women Hyperlipidemia; she is scheduled for lab work  Diabetes does not have to her knowledge Family History (mother arthritis; HTN; Mac Deg; Father had cancer; Sister had OA)  Obesity 31.4 will defer weight management until health issues settle down  Depression Screen/ no/ managing grief and loss Still has interest and pleasure in doing things  PhQ 2: negative  Ad8 score reviewed for issues:  Issues making decisions:   Less interest in hobbies / activities:  Repeats questions, stories (family complaining):  Trouble using ordinary gadgets (microwave, computer, phone):  Forgets the month or year:   Mismanaging finances:   Remembering appts:  Daily problems with thinking and/or memory: Ad8 score is=0  Activities of Daily Living - See functional screen   Hearing Difficulty: normal  Ophthalmology Exam: will get an apt. It has been 2 years; dr. Dagoberto LigasStoneburner  Colonoscopy: Due colon cancer screening and declined today due to C-diff Discussed Colo guard   Cognitive testing;  Ad8 score; 0 or less than 2  MMSE deferred  or completed if AD8 + 2 issues  Advanced Directives yes she has completed   List the name of Physicians or other Practitioners you currently use: see note below   Immunization History  Administered Date(s) Administered  . Pneumococcal Conjugate-13 11/27/2015   Required Immunizations needed today  Screening test up to date or reviewed for plan of completion Health Maintenance Due  Topic Date Due  . TETANUS/TDAP  11/05/1961  . ZOSTAVAX  11/06/2002   Have requested history Flu shot: defer until she see [name];  Sensitive in  neosporin; which is in zostavax; will postpone Tdap - will check on pharmacy Will take immunizations when she is feeling better again.  The following information was reviewed  Allergies; Medications; Past Medical Hx; Problem list; Surgical hx; Family hx; Social Hx   Cardiac Risk Factors include: advanced age (>5men, >3 women)     Objective:     Vitals: BP 140/80   Ht 5\' 3"  (1.6 m)   Wt 177 lb (80.3 kg)   BMI 31.35 kg/m   Body mass index is 31.35 kg/m.   Tobacco History  Smoking Status  . Never Smoker  Smokeless Tobacco  . Never Used     Counseling given: Yes   Past Medical History:  Diagnosis Date  . Allergy   . C. difficile colitis   . C. difficile diarrhea   . Glaucoma   . Osteopenia    took fosomax remotely for a few years  . Rosacea    -had surgery with Dr. Roda Shutters remortely; sees Dr. Dagoberto Ligas   Past Surgical History:  Procedure Laterality Date  . CATARACT EXTRACTION, BILATERAL    . LYMPH NODE BIOPSY     benign per patient  . lymphnode biopsy    . TUBAL LIGATION     Family History  Problem Relation Age of Onset  . Arthritis Mother   . Hypertension Mother   . Macular degeneration Mother   . Cancer Father   . Arthritis Sister   . Hypertension Sister   . Cancer Maternal Aunt    History  Sexual Activity  . Sexual activity: Not on file    Outpatient Encounter Prescriptions as of 02/02/2016  Medication Sig  . Cholecalciferol (VITAMIN D PO) Take 1,000 mg by mouth daily.   . metroNIDAZOLE (METROCREAM) 0.75 % cream Apply 1 application topically daily. For rosacea  . vancomycin (VANCOCIN) 125 MG capsule Take 1 capsule 4 times a day for 2 weeks, then twice a day for 1 week, then once a day for 1 week (Patient not taking: Reported on 02/02/2016)  . vancomycin (VANCOCIN) 125 MG capsule (continuation of previous taper) take 1 capsule by mouth twice daily for 7 days; then 1 capsule by mouth daily for 7 days; then 1 capsule by mouth every 3 days for 2  weeks. (Patient not taking: Reported on 02/02/2016)   No facility-administered encounter medications on file as of 02/02/2016.     Activities of Daily Living In your present state of health, do you have any difficulty performing the following activities: 02/02/2016  Hearing? N  Vision? N  Difficulty concentrating or making decisions? N  Walking or climbing stairs? N  Dressing or bathing? N  Doing errands, shopping? N  Preparing Food and eating ? N  Using the Toilet? N  In the past six months, have you accidently leaked urine? N  Do you have problems with loss of bowel control? Y  Managing your Medications? N  Managing your Finances?  N  Housekeeping or managing your Housekeeping? N  Some recent data might be hidden    Patient Care Team: Terressa KoyanagiHannah R Kim, DO as PCP - General (Family Medicine) Judyann Munsonynthia Snider, MD as Consulting Physician (Infectious Diseases) Shea EvansVaishali Mody, MD as Consulting Physician (Obstetrics and Gynecology)    Assessment:    ASSESSMENT INCLUDED:   Review for health history including a functional assessment, fall risk, depression screen, memory loss, vision and hearing screens; Was educated and referred as appropriate.   Psychosocial risk reviewed as stress; unresolved grief;  Would like to travel;   Behavioral risk addressed such as exercise Deferred weight due to resolving c diff  Risk for safety; Bathroom; community; firearms, sun protection; auto accidents   All immunizations and overdue screens were reviewed for a plan or follow-up.   Labs were reviewed in regard to Lipids and A1c if appropriate.   Discussed Recommended screenings and documented any personalized health advice and referrals for preventive counseling.  See AVS for patient instructions;     Exercise Activities and Dietary recommendations Current Exercise Habits: Home exercise routine, Exercise limited by: None identified;Other - see comments (6 months of cdiff)  Goals    . patient           Start to plan trips;  [facility] when symptoms resolve       Fall Risk Fall Risk  02/02/2016 11/13/2015  Falls in the past year? No No   Depression Screen PHQ 2/9 Scores 02/02/2016 11/13/2015  PHQ - 2 Score 0 1     Cognitive Function MMSE - Mini Mental State Exam 02/02/2016  Not completed: (No Data)    Ad8 score is 0     Immunization History  Administered Date(s) Administered  . Pneumococcal Conjugate-13 11/27/2015   Screening Tests Health Maintenance  Topic Date Due  . TETANUS/TDAP  11/05/1961  . ZOSTAVAX  11/06/2002  . INFLUENZA VACCINE  02/27/2016 (Originally 10/31/2015)  . COLONOSCOPY  11/26/2025 (Originally 11/05/1992)  . PNA vac Low Risk Adult (2 of 2 - PPSV23) 11/26/2016  . MAMMOGRAM  11/06/2017  . DEXA SCAN  Completed      Plan:     Plan to see Dietician Now taking 2 probiotics; cultural; activa; Kefir which is a drink  Fermented yogurt;  Deferred to Dr. Drue SecondSnider;  Does not eat heavy; not eating large quanties  Would like to see a dietician  Was still cautious about taking anything; May request to see a dietician Will defer flu vaccine until next week  During the course of the visit the patient was educated and counseled about the following appropriate screening and preventive services:   Vaccines to include Pneumoccal, Influenza, Hepatitis B, Td, Zostavax, HCV  Electrocardiogram  Cardiovascular Disease  Colorectal cancer screening/ postponed due to cdiff   Bone density screening/ May want to repeat; to discuss with Dr. Selena BattenKim   Diabetes screening - blood work to be drawn  Glaucoma screening/to schedule eye exam  Mammography/ to repeat  Nutrition counseling   Patient Instructions (the written plan) was given to the patient.   Montine CircleHauck,Jakel Alphin, RN  02/02/2016

## 2016-02-02 NOTE — Patient Instructions (Addendum)
Monica Lamb , Thank you for taking time to come for your Medicare Wellness Visit. I appreciate your ongoing commitment to your health goals. Please review the following plan we discussed and let me know if I can assist you in the future.   Will make an eye apt soon;   Will fup with Dr. Maudie Mercury regarding DEXA 2014; better; may look for reports at home or other   Will hold on flu until you see Dr. Maudie Mercury  Fup Tdap may be cheaper at the pharmacy;   Can review the shingles vaccine with md; seen note below     These are the goals we discussed: Goals    . patient          Start to plan trips;  Go back to the [facility] when symptoms resolve        This is a list of the screening recommended for you and due dates:  Health Maintenance  Topic Date Due  . Tetanus Vaccine  [birth year]/1961  . Shingles Vaccine  11/06/2002  . Flu Shot  02/27/2016*  . Colon Cancer Screening  11/26/2025*  . Pneumonia vaccines (2 of 2 - PPSV23) 11/26/2016  . Mammogram  11/06/2017  . DEXA scan (bone density measurement)  Completed  *Topic was postponed. The date shown is not the original due date.   Educated to check with insurance regarding coverage of Shingles vaccination on Part D or Part B and may have lower co-pay if provided on the Part D side  Important Safety Information ZOSTAVAX does not protect everyone, so some people who get the vaccine may still get Shingles.  You should not get ZOSTAVAX if you are allergic to any of its ingredients, including gelatin or neomycin, have a weakened immune system, take high doses of steroids, or are pregnant or plan to become pregnant. You should not get ZOSTAVAX to prevent chickenpox.  Talk to your health care professional if you plan to get ZOSTAVAX at the same time as PNEUMOVAX23 (Pneumococcal Vaccine Polyvalent) because it may be better to get these vaccines at least 4 weeks apart.  Possible side effects include redness, pain, itching, swelling, hard lump, warmth, or  bruising at the injection site, as well as headache.  ZOSTAVAX contains a weakened chickenpox virus. Tell your health care professional if you will be in close contact with newborn infants, someone who may be pregnant and has not had chickenpox or been vaccinated against chickenpox, or someone who has problems with their immune system. Your health care professional can tell you what situations you may need to avoid. You are encouraged to report negative side effects of prescription drugs to the FDA. Visit [employer], or call 1-800-FDA-1088.      Fall Prevention in the Home  Falls can cause injuries. They can happen to people of all ages. There are many things you can do to make your home safe and to help prevent falls.  WHAT CAN I DO ON THE OUTSIDE OF MY HOME?  Regularly fix the edges of walkways and driveways and fix any cracks.  Remove anything that might make you trip as you walk through a door, such as a raised step or threshold.  Trim any bushes or trees on the path to your home.  Use bright outdoor lighting.  Clear any walking paths of anything that might make someone trip, such as rocks or tools.  Regularly check to see if handrails are loose or broken. Make sure that both sides of any  steps have handrails.  Any raised decks and porches should have guardrails on the edges.  Have any leaves, snow, or ice cleared regularly.  Use sand or salt on walking paths during winter.  Clean up any spills in your garage right away. This includes oil or grease spills. WHAT CAN I DO IN THE BATHROOM?   Use night lights.  Install grab bars by the toilet and in the tub and shower. Do not use towel bars as grab bars.  Use non-skid mats or decals in the tub or shower.  If you need to sit down in the shower, use a plastic, non-slip stool.  Keep the floor dry. Clean up any water that spills on the floor as soon as it happens.  Remove soap buildup in the tub or shower  regularly.  Attach bath mats securely with double-sided non-slip rug tape.  Do not have throw rugs and other things on the floor that can make you trip. WHAT CAN I DO IN THE BEDROOM?  Use night lights.  Make sure that you have a light by your bed that is easy to reach.  Do not use any sheets or blankets that are too big for your bed. They should not hang down onto the floor.  Have a firm chair that has side arms. You can use this for support while you get dressed.  Do not have throw rugs and other things on the floor that can make you trip. WHAT CAN I DO IN THE KITCHEN?  Clean up any spills right away.  Avoid walking on wet floors.  Keep items that you use a lot in easy-to-reach places.  If you need to reach something above you, use a strong step stool that has a grab bar.  Keep electrical cords out of the way.  Do not use floor polish or wax that makes floors slippery. If you must use wax, use non-skid floor wax.  Do not have throw rugs and other things on the floor that can make you trip. WHAT CAN I DO WITH MY STAIRS?  Do not leave any items on the stairs.  Make sure that there are handrails on both sides of the stairs and use them. Fix handrails that are broken or loose. Make sure that handrails are as long as the stairways.  Check any carpeting to make sure that it is firmly attached to the stairs. Fix any carpet that is loose or worn.  Avoid having throw rugs at the top or bottom of the stairs. If you do have throw rugs, attach them to the floor with carpet tape.  Make sure that you have a light switch at the top of the stairs and the bottom of the stairs. If you do not have them, ask someone to add them for you. WHAT ELSE CAN I DO TO HELP PREVENT FALLS?  Wear shoes that:  Do not have high heels.  Have rubber bottoms.  Are comfortable and fit you well.  Are closed at the toe. Do not wear sandals.  If you use a stepladder:  Make sure that it is fully  opened. Do not climb a closed stepladder.  Make sure that both sides of the stepladder are locked into place.  Ask someone to hold it for you, if possible.  Clearly mark and make sure that you can see:  Any grab bars or handrails.  First and last steps.  Where the edge of each step is.  Use tools that help you  move around (mobility aids) if they are needed. These include:  Canes.  Walkers.  Scooters.  Crutches.  Turn on the lights when you go into a dark area. Replace any light bulbs as soon as they burn out.  Set up your furniture so you have a clear path. Avoid moving your furniture around.  If any of your floors are uneven, fix them.  If there are any pets around you, be aware of where they are.  Review your medicines with your doctor. Some medicines can make you feel dizzy. This can increase your chance of falling. Ask your doctor what other things that you can do to help prevent falls.   This information is not intended to replace advice given to you by your health care provider. Make sure you discuss any questions you have with your health care provider.   Document Released: 01/12/2009 Document Revised: 08/02/2014 Document Reviewed: 04/22/2014 Elsevier Interactive Patient Education 2016 [place] Maintenance, Female Adopting a healthy lifestyle and getting preventive care can go a long way to promote health and wellness. Talk with your health care provider about what schedule of regular examinations is right for you. This is a good chance for you to check in with your provider about disease prevention and staying healthy. In between checkups, there are plenty of things you can do on your own. Experts have done a lot of research about which lifestyle changes and preventive measures are most likely to keep you healthy. Ask your health care provider for more information. WEIGHT AND DIET  Eat a healthy diet  Be sure to include plenty of vegetables, fruits,  low-fat dairy products, and lean protein.  Do not eat a lot of foods high in solid fats, added sugars, or salt.  Get regular exercise. This is one of the most important things you can do for your health.  Most adults should exercise for at least 150 minutes each week. The exercise should increase your heart rate and make you sweat (moderate-intensity exercise).  Most adults should also do strengthening exercises at least twice a week. This is in addition to the moderate-intensity exercise.  Maintain a healthy weight  Body mass index (BMI) is a measurement that can be used to identify possible weight problems. It estimates body fat based on height and weight. Your health care provider can help determine your BMI and help you achieve or maintain a healthy weight.  For females 60 years of age and older:   A BMI below 18.5 is considered underweight.  A BMI of 18.5 to 24.9 is normal.  A BMI of 25 to 29.9 is considered overweight.  A BMI of 30 and above is considered obese.  Watch levels of cholesterol and blood lipids  You should start having your blood tested for lipids and cholesterol at 73 years of age, then have this test every 5 years.  You may need to have your cholesterol levels checked more often if:  Your lipid or cholesterol levels are high.  You are older than 73 years of age.  You are at high risk for heart disease.  CANCER SCREENING   Lung Cancer  Lung cancer screening is recommended for adults 25-47 years old who are at high risk for lung cancer because of a history of smoking.  A yearly low-dose CT scan of the lungs is recommended for people who:  Currently smoke.  Have quit within the past 15 years.  Have at least a 30-pack-year history of  smoking. A pack year is smoking an average of one pack of cigarettes a day for 1 year.  Yearly screening should continue until it has been 15 years since you quit.  Yearly screening should stop if you develop a  health problem that would prevent you from having lung cancer treatment.  Breast Cancer  Practice breast self-awareness. This means understanding how your breasts normally appear and feel.  It also means doing regular breast self-exams. Let your health care provider know about any changes, no matter how small.  If you are in your 20s or 30s, you should have a clinical breast exam (CBE) by a health care provider every 1-3 years as part of a regular health exam.  If you are 73 or older, have a CBE every year. Also consider having a breast X-ray (mammogram) every year.  If you have a family history of breast cancer, talk to your health care provider about genetic screening.  If you are at high risk for breast cancer, talk to your health care provider about having an MRI and a mammogram every year.  Breast cancer gene (BRCA) assessment is recommended for women who have family members with BRCA-related cancers. BRCA-related cancers include:  Breast.  Ovarian.  Tubal.  Peritoneal cancers.  Results of the assessment will determine the need for genetic counseling and BRCA1 and BRCA2 testing. Cervical Cancer Your health care provider may recommend that you be screened regularly for cancer of the pelvic organs (ovaries, uterus, and vagina). This screening involves a pelvic examination, including checking for microscopic changes to the surface of your cervix (Pap test). You may be encouraged to have this screening done every 3 years, beginning at age 46.  For women ages 22-65, health care providers may recommend pelvic exams and Pap testing every 3 years, or they may recommend the Pap and pelvic exam, combined with testing for human papilloma virus (HPV), every 5 years. Some types of HPV increase your risk of cervical cancer. Testing for HPV may also be done on women of any age with unclear Pap test results.  Other health care providers may not recommend any screening for nonpregnant women who  are considered low risk for pelvic cancer and who do not have symptoms. Ask your health care provider if a screening pelvic exam is right for you.  If you have had past treatment for cervical cancer or a condition that could lead to cancer, you need Pap tests and screening for cancer for at least 20 years after your treatment. If Pap tests have been discontinued, your risk factors (such as having a new sexual partner) need to be reassessed to determine if screening should resume. Some women have medical problems that increase the chance of getting cervical cancer. In these cases, your health care provider may recommend more frequent screening and Pap tests. Colorectal Cancer  This type of cancer can be detected and often prevented.  Routine colorectal cancer screening usually begins at 74 years of age and continues through 73 years of age.  Your health care provider may recommend screening at an earlier age if you have risk factors for colon cancer.  Your health care provider may also recommend using home test kits to check for hidden blood in the stool.  A small camera at the end of a tube can be used to examine your colon directly (sigmoidoscopy or colonoscopy). This is done to check for the earliest forms of colorectal cancer.  Routine screening usually begins at age 48.  Direct examination of the colon should be repeated every 5-10 years through 73 years of age. However, you may need to be screened more often if early forms of precancerous polyps or small growths are found. Skin Cancer  Check your skin from head to toe regularly.  Tell your health care provider about any new moles or changes in moles, especially if there is a change in a mole's shape or color.  Also tell your health care provider if you have a mole that is larger than the size of a pencil eraser.  Always use sunscreen. Apply sunscreen liberally and repeatedly throughout the day.  Protect yourself by wearing long  sleeves, pants, a wide-brimmed hat, and sunglasses whenever you are outside. HEART DISEASE, DIABETES, AND HIGH BLOOD PRESSURE   High blood pressure causes heart disease and increases the risk of stroke. High blood pressure is more likely to develop in:  People who have blood pressure in the high end of the normal range (130-139/85-89 mm Hg).  People who are overweight or obese.  People who are African American.  If you are 27-8 years of age, have your blood pressure checked every 3-5 years. If you are 17 years of age or older, have your blood pressure checked every year. You should have your blood pressure measured twice--once when you are at a hospital or clinic, and once when you are not at a hospital or clinic. Record the average of the two measurements. To check your blood pressure when you are not at a hospital or clinic, you can use:  An automated blood pressure machine at a pharmacy.  A home blood pressure monitor.  If you are between 15 years and 31 years old, ask your health care provider if you should take aspirin to prevent strokes.  Have regular diabetes screenings. This involves taking a blood sample to check your fasting blood sugar level.  If you are at a normal weight and have a low risk for diabetes, have this test once every three years after 73 years of age.  If you are overweight and have a high risk for diabetes, consider being tested at a younger age or more often. PREVENTING INFECTION  Hepatitis B  If you have a higher risk for hepatitis B, you should be screened for this virus. You are considered at high risk for hepatitis B if:  You were born in a country where hepatitis B is common. Ask your health care provider which countries are considered high risk.  Your parents were born in a high-risk country, and you have not been immunized against hepatitis B (hepatitis B vaccine).  You have HIV or AIDS.  You use needles to inject street drugs.  You live with  someone who has hepatitis B.  You have had sex with someone who has hepatitis B.  You get hemodialysis treatment.  You take certain medicines for conditions, including cancer, organ transplantation, and autoimmune conditions. Hepatitis C  Blood testing is recommended for:  Everyone born from [birth year] through 1963.  Anyone with known risk factors for hepatitis C. Sexually transmitted infections (STIs)  You should be screened for sexually transmitted infections (STIs) including gonorrhea and chlamydia if:  You are sexually active and are younger than 73 years of age.  You are older than 73 years of age and your health care provider tells you that you are at risk for this type of infection.  Your sexual activity has changed since you were last screened and you are  at an increased risk for chlamydia or gonorrhea. Ask your health care provider if you are at risk.  If you do not have HIV, but are at risk, it may be recommended that you take a prescription medicine daily to prevent HIV infection. This is called pre-exposure prophylaxis (PrEP). You are considered at risk if:  You are sexually active and do not regularly use condoms or know the HIV status of your partner(s).  You take drugs by injection.  You are sexually active with a partner who has HIV. Talk with your health care provider about whether you are at high risk of being infected with HIV. If you choose to begin PrEP, you should first be tested for HIV. You should then be tested every 3 months for as long as you are taking PrEP.  PREGNANCY   If you are premenopausal and you may become pregnant, ask your health care provider about preconception counseling.  If you may become pregnant, take 400 to 800 micrograms (mcg) of folic acid every day.  If you want to prevent pregnancy, talk to your health care provider about birth control (contraception). OSTEOPOROSIS AND MENOPAUSE   Osteoporosis is a disease in which the bones lose  minerals and strength with aging. This can result in serious bone fractures. Your risk for osteoporosis can be identified using a bone density scan.  If you are 25 years of age or older, or if you are at risk for osteoporosis and fractures, ask your health care provider if you should be screened.  Ask your health care provider whether you should take a calcium or vitamin D supplement to lower your risk for osteoporosis.  Menopause may have certain physical symptoms and risks.  Hormone replacement therapy may reduce some of these symptoms and risks. Talk to your health care provider about whether hormone replacement therapy is right for you.  HOME CARE INSTRUCTIONS   Schedule regular health, dental, and eye exams.  Stay current with your immunizations.   Do not use any tobacco products including cigarettes, chewing tobacco, or electronic cigarettes.  If you are pregnant, do not drink alcohol.  If you are breastfeeding, limit how much and how often you drink alcohol.  Limit alcohol intake to no more than 1 drink per day for nonpregnant women. One drink equals 12 ounces of beer, 5 ounces of wine, or 1 ounces of hard liquor.  Do not use street drugs.  Do not share needles.  Ask your health care provider for help if you need support or information about quitting drugs.  Tell your health care provider if you often feel depressed.  Tell your health care provider if you have ever been abused or do not feel safe at home.   This information is not intended to replace advice given to you by your health care provider. Make sure you discuss any questions you have with your health care provider.   Document Released: 10/01/2010 Document Revised: 04/08/2014 Document Reviewed: 02/17/2013 Elsevier Interactive Patient Education 2016 [employer].   Hearing Loss Hearing loss is a partial or total loss of the ability to hear. This can be temporary or permanent, and it can happen in one or both  ears. Hearing loss may be referred to as deafness. Medical care is necessary to treat hearing loss properly and to prevent the condition from getting worse. Your hearing may partially or completely come back, depending on what caused your hearing loss and how severe it is. In some cases, hearing loss  is permanent. CAUSES Common causes of hearing loss include:   Too much wax in the ear canal.   Infection of the ear canal or middle ear.   Fluid in the middle ear.   Injury to the ear or surrounding area.   An object stuck in the ear.   Prolonged exposure to loud sounds, such as music.  Less common causes of hearing loss include:   Tumors in the ear.   Viral or bacterial infections, such as meningitis.   A hole in the eardrum (perforated eardrum).  Problems with the hearing nerve that sends signals between the brain and the ear.  Certain medicines.  SYMPTOMS  Symptoms of this condition may include:  Difficulty telling the difference between sounds.  Difficulty following a conversation when there is background noise.  Lack of response to sounds in your environment. This may be most noticeable when you do not respond to startling sounds.  Needing to turn up the volume on the television, radio, etc.  Ringing in the ears.  Dizziness.  Pain in the ears. DIAGNOSIS This condition is diagnosed based on a physical exam and a hearing test (audiometry). The audiometry test will be performed by a hearing specialist (audiologist). You may also be referred to an ear, nose, and throat (ENT) specialist (otolaryngologist).  TREATMENT Treatment for recent onset of hearing loss may include:   Ear wax removal.   Being prescribed medicines to prevent infection (antibiotics).   Being prescribed medicines to reduce inflammation (corticosteroids).  HOME CARE INSTRUCTIONS  If you were prescribed an antibiotic medicine, take it as told by your health care provider. Do not stop  taking the antibiotic even if you start to feel better.  Take over-the-counter and prescription medicines only as told by your health care provider.  Avoid loud noises.   Return to your normal activities as told by your health care provider. Ask your health care provider what activities are safe for you.  Keep all follow-up visits as told by your health care provider. This is important. SEEK MEDICAL CARE IF:   You feel dizzy.   You develop new symptoms.   You vomit or feel nauseous.   You have a fever.  SEEK IMMEDIATE MEDICAL CARE IF:  You develop sudden changes in your vision.   You have severe ear pain.   You have new or increased weakness.  You have a severe headache.   This information is not intended to replace advice given to you by your health care provider. Make sure you discuss any questions you have with your health care provider.   Document Released: 03/18/2005 Document Revised: 12/07/2014 Document Reviewed: 08/03/2014 Elsevier Interactive Patient Education [employer].

## 2016-02-02 NOTE — Progress Notes (Signed)
KIM, HANNAH R., DO  

## 2016-02-03 ENCOUNTER — Other Ambulatory Visit: Payer: Self-pay | Admitting: Internal Medicine

## 2016-02-03 DIAGNOSIS — A0471 Enterocolitis due to Clostridium difficile, recurrent: Secondary | ICD-10-CM

## 2016-02-03 MED ORDER — VANCOMYCIN HCL 125 MG PO CAPS: ORAL_CAPSULE | ORAL | 0 refills | Status: DC

## 2016-02-03 NOTE — Progress Notes (Signed)
cdiff testing + after starting having 8+ watery BM, showing a relapse of cdi.   - we will re-start her on oral vanco pills @ [facility]

## 2016-02-05 ENCOUNTER — Encounter: Payer: Self-pay | Admitting: Internal Medicine

## 2016-02-05 ENCOUNTER — Ambulatory Visit (INDEPENDENT_AMBULATORY_CARE_PROVIDER_SITE_OTHER): Payer: Medicare Other | Admitting: Internal Medicine

## 2016-02-05 VITALS — BP 135/84 | HR 82 | Temp 97.4°F | Wt 168.0 lb

## 2016-02-05 DIAGNOSIS — A09 Infectious gastroenteritis and colitis, unspecified: Secondary | ICD-10-CM

## 2016-02-05 DIAGNOSIS — R911 Solitary pulmonary nodule: Secondary | ICD-10-CM | POA: Diagnosis not present

## 2016-02-05 DIAGNOSIS — A0471 Enterocolitis due to Clostridium difficile, recurrent: Secondary | ICD-10-CM

## 2016-02-05 DIAGNOSIS — R197 Diarrhea, unspecified: Secondary | ICD-10-CM

## 2016-02-05 LAB — STOOL CULTURE

## 2016-02-05 NOTE — Progress Notes (Signed)
Patient ID: Monica BushmanSharon O Erisman, female   DOB: 03/29/1941, 75 y.o.   MRN: [id number]  HPI  [name] is a 75yo F. with rosacea, vitamin deficiency, who has had recurrent c.difficile.  She was given a 10d course of clindamycin for dental abscess from May 23-June 2nd.  She started to have diarrhea started on June 12th, and went to the UC center, tested on 6/14 which was positive. @ that time she had15 BMs per day.lsot #5 lb at that course, which was treated with 10 day of flagyl. She felt that she recovered, had normal bowel movements by the end of the treatment  In beginning of July, She noticed having recurrent watery, foul smelling, and given now oral vancomycin with a taper.  She takes probiotics with her regimen. She had finished her taper on July 28th but then started to have relapse of symptoms  She returned to 8/6 for recurrent symptoms, concerning for her 2nd relapse. cdifficile.  She was reinitiated treatment but was not retested. We las saw her in mid August when she was finishing out course of vanco taper.   She was doing well up until 11/2 when her symptoms of mucousy, watery diarrhea escalated to 8-15 times per day. We retested her which was positive by PCR. She was started on oral vanco pills on 11/4. She reports that her BM had decrease in frequency since starting vancomycin 2 days ago  Patient is interested [facility] participating [facility] since her son and daughter in law live in [place]. Her daughter in law is a transplant icu Charity fundraiserN. She feels that since she is alone here in [place], that she needs family assistance in getting her to endoscopy on the day of FMT.  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 CT did also show a 4mm x 4mm isolated pulm nodule in RML.   FHX: her father was a 3ppd smoker, died of lung cancer. She also had a sister that died of lung  cancer 2 yrs ago who was a non-smoker, but she is unsure if it was primary lung ca.  Outpatient Encounter Prescriptions as of 02/05/2016  Medication Sig  . Cholecalciferol (VITAMIN D PO) Take 1,000 mg by mouth daily.   . metroNIDAZOLE (METROCREAM) 0.75 % cream Apply 1 application topically daily. For rosacea  . vancomycin (VANCOCIN) 125 MG capsule (continuation of previous taper) take 1 capsule by mouth twice daily for 7 days; then 1 capsule by mouth daily for 7 days; then 1 capsule by mouth every 3 days for 2 weeks. (Patient not taking: Reported on 02/02/2016)  . vancomycin (VANCOCIN) 125 MG capsule Take 1 capsule 4 times a day for 2 weeks  . [DISCONTINUED] vancomycin (VANCOCIN) 125 MG capsule Take 1 capsule 4 times a day for 2 weeks, then twice a day for 1 week, then once a day for 1 week (Patient not taking: Reported on 02/02/2016)   No facility-administered encounter medications on file as of 02/05/2016.      Patient Active Problem List   Diagnosis Date Noted  . Rosacea 11/27/2015  . Osteopenia 11/27/2015  . BMI 34.0-34.9,adult 11/27/2015  . 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 Abdominal wall mass of left flank 11/13/2015     Health Maintenance Due  Topic Date Due  . TETANUS/TDAP  11/05/1961  . ZOSTAVAX  11/06/2002  Review of Systems  Physical Exam   BP 135/84   Pulse 82   Temp 97.4 F (36.3 C) (Oral)   Wt 168 lb (76.2 kg)   BMI 29.76 kg/m  Physical Exam  Constitutional:  oriented to person, place, and time. appears well-developed and well-nourished. No distress.  HENT: [place]/AT, PERRLA, no scleral icterus Mouth/Throat: Oropharynx is clear and moist. No oropharyngeal exudate.  Cardiovascular: Normal rate, regular rhythm and normal heart sounds. Exam reveals no gallop and no friction rub.  No murmur heard.  Pulmonary/Chest: Effort normal and breath sounds normal. No respiratory distress.  has no wheezes.  Neck = supple, no nuchal rigidity Abdominal:  Soft. Bowel sounds are normal.  exhibits no distension. There is no tenderness.  Lymphadenopathy: no cervical adenopathy. No axillary adenopathy Neurological: alert and oriented to person, place, and time.  Skin: Skin is warm and dry. No rash noted. No erythema.  Psychiatric: a normal mood and affect.  behavior is normal.    Assessment and Plan  Diarrhea = likely recurrent cdifficile colitis. She appears to be staying hydrated. Continue with oral vancomycin. Will likely do another taper but plan [facility] c[facility]  Will give listing of low FODMAPs diet  Pulm nodule = will repeat chest CT in 6-12 months.  Spent 35 min with patient with greater than 50%/ majority of hte time discussing modalities to treat cdifficile via FMT

## 2016-02-06 ENCOUNTER — Encounter: Payer: Self-pay | Admitting: Family Medicine

## 2016-02-06 ENCOUNTER — Ambulatory Visit (INDEPENDENT_AMBULATORY_CARE_PROVIDER_SITE_OTHER): Payer: Medicare Other | Admitting: Family Medicine

## 2016-02-06 ENCOUNTER — Telehealth: Payer: Self-pay | Admitting: *Deleted

## 2016-02-06 VITALS — BP 110/62 | HR 73 | Temp 97.6°F | Ht 62.0 in | Wt 178.8 lb

## 2016-02-06 DIAGNOSIS — R9389 Abnormal findings on diagnostic imaging of other specified body structures: Secondary | ICD-10-CM

## 2016-02-06 DIAGNOSIS — Z136 Encounter for screening for cardiovascular disorders: Secondary | ICD-10-CM | POA: Diagnosis not present

## 2016-02-06 DIAGNOSIS — R911 Solitary pulmonary nodule: Secondary | ICD-10-CM

## 2016-02-06 DIAGNOSIS — Z23 Encounter for immunization: Secondary | ICD-10-CM

## 2016-02-06 DIAGNOSIS — R938 Abnormal findings on diagnostic imaging of other specified body structures: Secondary | ICD-10-CM

## 2016-02-06 DIAGNOSIS — M8580 Other specified disorders of bone density and structure, unspecified site: Secondary | ICD-10-CM | POA: Diagnosis not present

## 2016-02-06 DIAGNOSIS — E2839 Other primary ovarian failure: Secondary | ICD-10-CM

## 2016-02-06 DIAGNOSIS — E785 Hyperlipidemia, unspecified: Secondary | ICD-10-CM

## 2016-02-06 DIAGNOSIS — A0471 Enterocolitis due to Clostridium difficile, recurrent: Secondary | ICD-10-CM | POA: Diagnosis not present

## 2016-02-06 DIAGNOSIS — Z6834 Body mass index (BMI) 34.0-34.9, adult: Secondary | ICD-10-CM

## 2016-02-06 DIAGNOSIS — R739 Hyperglycemia, unspecified: Secondary | ICD-10-CM

## 2016-02-06 HISTORY — DX: Solitary pulmonary nodule: R91.1

## 2016-02-06 LAB — LIPID PANEL
CHOL/HDL RATIO: 3
Cholesterol: 139 mg/dL (ref 0–200)
HDL: 49.8 mg/dL (ref >39.00)
LDL Cholesterol: 74 mg/dL (ref 0–99)
NONHDL: 89.09
Triglycerides: 76 mg/dL (ref 0.0–149.0)
VLDL: 15.2 mg/dL (ref 0.0–40.0)

## 2016-02-06 LAB — HEMOGLOBIN A1C: HEMOGLOBIN A1C: 5.7 % (ref 4.6–6.5)

## 2016-02-06 NOTE — Telephone Encounter (Signed)
I called [facility] and spoke with Arena to get more information on a diagnosis that would be covered for the pts CT that is due in  Months.  Racheal Patchesrena stated she spoke with Dondra SpryGail as well and they do not know of any other codes to use other than pulmonary nodule, abnormal CT or abnormal x-ray.  Message sent to Dr Selena BattenKim.

## 2016-02-06 NOTE — Progress Notes (Signed)
Pre visit review using our clinic review tool, if applicable. No additional management support is needed unless otherwise documented below in the visit note. 

## 2016-02-06 NOTE — Progress Notes (Addendum)
HPI:  Monica Lamb is here for a follow-up visit. She recently saw Montine Circle for her Medicare wellness visit. She would like to discuss whether or not a DEXA as needed and get her lab work. She reports a hx of osteopenia treated with 2 years of fosamax. No issues with medication tolerance. Reports her endocrinologist at the time told her no longer needed medication. Reports last dexa 5 years ago with Dr. Horald Pollen. She wants to recheck at breast center. No wt bearing exercise. She had incidentally pulm nodule on CT done with ID.  She has a hx of extensive 2nd hand smoke exposure and sister (not a smoker) died of lung Ca. She first said she did not want to repeat a CT as had discussed with ID, then wanted [place] to order repeat CT in 12 months after further discussion. She unfortunately has been struggling with a a C. diff infection and is seeing Dr. Ilsa Iha in infectious disease for management. She reports she is considering a fecal transplant and that ID is currently looking into options. Reports a poor diet and doing lots of dairy (kefir) but feels this might be making thing worse. She sees a gynecologist, Dr. Juliene Pina for her women's health exams. She is not on any chronic medications.  Due for colon cancer screening but is holding off as may need colonosocpy for fecal transplant per her report. BMI is 34.   ROS: See pertinent positives and negatives per HPI.  Past Medical History:  Diagnosis Date  . Allergy   . C. difficile diarrhea   . Glaucoma   . Osteopenia    took fosomax remotely for a few years  . Rosacea    -had surgery with Dr. Roda Shutters remortely; sees Dr. Dagoberto Ligas    Past Surgical History:  Procedure Laterality Date  . CATARACT EXTRACTION, BILATERAL    . LYMPH NODE BIOPSY     benign per patient  . lymphnode biopsy    . TUBAL LIGATION      Family History  Problem Relation Age of Onset  . Arthritis Mother   . Hypertension Mother   . Macular degeneration Mother   . Cancer  Father   . Arthritis Sister   . Hypertension Sister   . Cancer Maternal Aunt     Social History   Social History  . Marital status: Widowed    Spouse name: N/A  . Number of children: N/A  . Years of education: N/A   Social History Main Topics  . Smoking status: Never Smoker  . Smokeless tobacco: Never Used  . Alcohol use No  . Drug use: No  . Sexual activity: Not Asked   Other Topics Concern  . 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  Cholecalciferol (VITAMIN D PO), Take 1,000 Units by mouth daily. , Disp: , Rfl:  .  metroNIDAZOLE (METROCREAM) 0.75 % cream, Apply 1 application topically daily. For rosacea, Disp: , Rfl:  .  vancomycin (VANCOCIN) 125 MG capsule, (continuation of previous taper) take 1 capsule by mouth twice daily for 7 days; then 1 capsule by  mouth daily for 7 days; then 1 capsule by mouth every 3 days for 2 weeks., Disp: 27 capsule, Rfl: 0 .  vancomycin (VANCOCIN) 125 MG capsule, Take 1 capsule 4 times a day for 2 weeks, Disp: 56 capsule, Rfl: 0  EXAM:  Vitals:   02/06/16 1126  BP: 110/62  Pulse: 73  Temp: 97.6 F (36.4 C)    Body mass index is 32.7 kg/m.  GENERAL: vitals reviewed and listed above, alert, oriented, appears well hydrated and in no acute distress  HEENT: atraumatic, conjunttiva clear, no obvious abnormalities on inspection of external nose and ears  NECK: no obvious masses on inspection  LUNGS: clear to auscultation bilaterally, no wheezes, rales or rhonchi, good air movement  CV: HRRR, no peripheral edema  MS: moves all extremities without noticeable abnormality  PSYCH: pleasant and cooperative, no obvious  depression or anxiety  ASSESSMENT AND PLAN:  Discussed the following assessment and plan:  BMI 34.0-34.9,adult  Osteopenia, unspecified location - Plan: DG Bone Density  Recurrent Clostridium difficile diarrhea  Hyperglycemia - Plan: Hemoglobin A1c  Hyperlipidemia, unspecified hyperlipidemia type  Screening for cardiovascular condition - Plan: Lipid Panel  Estrogen deficiency - Plan: DG Bone Density  Abnormal CT of the chest  -unable to order lung CT due to medicare/epic warning - advised assistant to contact [facility] for order and dx with her medicare plan  -fasting lipids and diabetes screening given obesity -lifestyle recs for obesity -dexa ordered for her hx osteopenia per her wishes -Patient advised to return or notify a doctor immediately if symptoms worsen or persist or new concerns arise.  Addendum: would need to sign medicare waiver to order CT and pt declined and requested that we NOT order at this time. She agreed to call us back if changed her mind.  Patient Instructions  BEFORE YOU LEAVE: -flu shot -labs -follow up: 6 months and as needed  I ordered a bone density test and the 1 year CT scan today. It usually takes about 1-2 weeks to process and schedule this referral. If you have not heard from us regarding this appointment in 2 weeks please contact our office.  Try a no dairy healthy diet. Continue a probiotic - Culturelle or Align.  We have ordered labs or studies at this visit. It can take up to 1-2 weeks for results and processing. IF results require follow up or explanation, we will call you with instructions. Clinically stable results will be released to your [facility]. If you have not heard from us or cannot find your results in [facility] in 2 weeks please contact our office at [phone].  If you are not yet signed up for [facility], please consider signing up.   We recommend the following healthy lifestyle for LIFE: 1) Small portions.   Tip: eat off  of a salad plate instead of a dinner plate.  Tip: It is ok to feel hungry after a meal of proper portion sizes  Tip: if you need more or a snack choose fruits, veggies and/or a handful of nuts or seeds.  2) Eat a healthy clean diet.  * Tip: Avoid (less then 1 serving per week): processed foods, sweets, sweetened drinks, white starches (rice, flour, bread, potatoes, pasta, etc), red meat, fast foods, butter  *Tip: CHOOSE instead   * 5-9 servings per day of fresh or frozen fruits and vegetables (but not corn, potatoes, bananas, canned or dried fruit)   *nuts and seeds, beans   *olives and olive oil   *  small portions of lean meats such as fish and white chicken    *small portions of whole grains  3)Get at least 150 minutes of exercise per week.  4)Reduce stress - consider counseling, meditation and relaxation to balance other aspects of your life.          Kriste BasqueKIM, HANNAH R., DO

## 2016-02-06 NOTE — Patient Instructions (Signed)
BEFORE YOU LEAVE: -flu shot -labs -follow up: 6 months and as needed  I ordered a bone density test and the 1 year CT scan today. It usually takes about 1-2 weeks to process and schedule this referral. If you have not heard from us regarding this appointment in 2 weeks please contact our office.  Try a no dairy healthy diet. Continue a probiotic - Culturelle or Align.  We have ordered labs or studies at this visit. It can take up to 1-2 weeks for results and processing. IF results require follow up or explanation, we will call you with instructions. Clinically stable results will be released to your [facility]. If you have not heard from us or cannot find your results in [facility] in 2 weeks please contact our office at [phone].  If you are not yet signed up for [facility], please consider signing up.   We recommend the following healthy lifestyle for LIFE: 1) Small portions.   Tip: eat off of a salad plate instead of a dinner plate.  Tip: It is ok to feel hungry after a meal of proper portion sizes  Tip: if you need more or a snack choose fruits, veggies and/or a handful of nuts or seeds.  2) Eat a healthy clean diet.  * Tip: Avoid (less then 1 serving per week): processed foods, sweets, sweetened drinks, white starches (rice, flour, bread, potatoes, pasta, etc), red meat, fast foods, butter  *Tip: CHOOSE instead   * 5-9 servings per day of fresh or frozen fruits and vegetables (but not corn, potatoes, bananas, canned or dried fruit)   *nuts and seeds, beans   *olives and olive oil   *small portions of lean meats such as fish and white chicken    *small portions of whole grains  3)Get at least 150 minutes of exercise per week.  4)Reduce stress - consider counseling, meditation and relaxation to balance other aspects of your life.

## 2016-02-06 NOTE — Telephone Encounter (Signed)
See results note. 

## 2016-02-06 NOTE — Telephone Encounter (Signed)
See result note. Please have her sign waiver to order. Thanks.

## 2016-02-14 ENCOUNTER — Telehealth: Payer: Self-pay | Admitting: *Deleted

## 2016-02-14 NOTE — Telephone Encounter (Signed)
Patient called stating she is almost out of her vancomycin and she thought she was suppose to be on a tapering dose. She will need a refill if she is to continue. Also Dr. Drue SecondSnider had told her [facility]. She is wondering if there are any available at this time. Please advise

## 2016-02-15 ENCOUNTER — Telehealth: Payer: Self-pay | Admitting: *Deleted

## 2016-02-15 ENCOUNTER — Other Ambulatory Visit: Payer: Self-pay | Admitting: Internal Medicine

## 2016-02-15 DIAGNOSIS — A0472 Enterocolitis due to Clostridium difficile, not specified as recurrent: Secondary | ICD-10-CM

## 2016-02-15 NOTE — Telephone Encounter (Signed)
Verbal order per Dr Drue SecondSnider for vancomycin taper 125 mg capsules - 1 capsule 3 times a day for 7 days then 1 capsule 2 times a day for 21 days.  [facility]. Andree CossHowell, Yordan Martindale M, RN

## 2016-02-15 NOTE — Telephone Encounter (Signed)
Relayed message to patient. She picked up the oral vancomycin today. She has stopped taking Kefir 8oz, activa, but is still taking Culturelle - oral probiotic. She has noticed a decrease in frequency of stools and also the volume of mucous that she is passing with the diet change. She wants Dr Drue SecondSnider to know that even with the recurrence of c diff diarrhea, she is feeling the best that she has these past 10 days.

## 2016-02-15 NOTE — Telephone Encounter (Signed)
-----   Message from Judyann Munsonynthia Snider, MD sent at 02/15/2016  1:07 PM EST ----- Can you let mrs. Monica Lamb know that she will be getting a call from Dr[facility] Kirst[facility] PeriGary Cox [facility] who will be talking to her about the fecal transplant

## 2016-02-16 NOTE — Telephone Encounter (Signed)
Thanks for the update. I spoke with dr cox, the ID guy from [facility] running the FMT trial. He was going to reach out to her this week

## 2016-03-13 ENCOUNTER — Other Ambulatory Visit: Payer: Self-pay | Admitting: Internal Medicine

## 2016-03-13 ENCOUNTER — Telehealth: Payer: Self-pay | Admitting: *Deleted

## 2016-03-13 DIAGNOSIS — A0472 Enterocolitis due to Clostridium difficile, not specified as recurrent: Secondary | ICD-10-CM

## 2016-03-13 NOTE — Telephone Encounter (Signed)
[facility].  She is almost done with her oral vancomycin 125 mg twice a day.  She reports her stools are almost normal.  She will complete her vancomycin on Friday evening.   Please advise if she should still be taking vancomycin at twice daily, if she needs a taper to zero, or if she needs to follow up somehow with [facility]. Monica CossHowell, Jaydenn Boccio M, RN

## 2016-03-15 NOTE — Telephone Encounter (Signed)
Per Dr Drue SecondSnider the patient should stop the taper so no to the refill of Vanc. Patient is aware. Reminded of follow up appointment. Advised to call the office if symptoms resume.

## 2016-03-18 ENCOUNTER — Ambulatory Visit (INDEPENDENT_AMBULATORY_CARE_PROVIDER_SITE_OTHER): Payer: Medicare Other | Admitting: Internal Medicine

## 2016-03-18 ENCOUNTER — Encounter: Payer: Self-pay | Admitting: Internal Medicine

## 2016-03-18 VITALS — BP 148/84 | HR 71 | Temp 97.6°F | Ht 63.0 in | Wt 179.0 lb

## 2016-03-18 DIAGNOSIS — A0471 Enterocolitis due to Clostridium difficile, recurrent: Secondary | ICD-10-CM

## 2016-03-18 NOTE — Progress Notes (Signed)
RFV: follow up for recurrent C.difficile  Patient ID: Monica BushmanSharon O Lamb, female   DOB: 07/15/1940, 75 y.o.   MRN: [id number]  HPI  Monica Lamb is a 75yo F with recurrent c.difficile infection. She had been on prolonged taper. now on once a day oral vanco 125mg  d2 of 7, then moving to QOD x 7 days. [facility] for   Aline plus oral vanco capsule. Interestingly, less mucous in stool since stopping kefir/activia  Yesterday, she had 3 BM yesterday. 2 in the morning ( one when she wakes up and one after eating breakfast) and one in the late afternoon. Soft and formed. She keeps diary of BM.  She now feels better, getting back to normal.  C difficile course:  Cdiff started after finishing a course of clindamycin for dental infection/work in late May.treated with flagyl iniitally, but the relapsed 7 days after finishing the course of abtx then she was placed on oral vancomycin.   Social hx: has son from [place] visiting and will spend holidays with her son ->  In [place].   Outpatient Encounter Prescriptions as of 03/18/2016  Medication Sig  . Cholecalciferol (VITAMIN D PO) Take 1,000 Units by mouth daily.   . metroNIDAZOLE (METROCREAM) 0.75 % cream Apply 1 application topically daily. For rosacea  . vancomycin (VANCOCIN) 125 MG capsule (continuation of previous taper) take 1 capsule by mouth twice daily for 7 days; then 1 capsule by mouth daily for 7 days; then 1 capsule by mouth every 3 days for 2 weeks.  . vancomycin (VANCOCIN) 125 MG capsule Take 1 capsule 3 times a day for 7 days then Take 1 capsule 2 times a day for 21 days   No facility-administered encounter medications on file as of 03/18/2016.      Patient Active Problem List   Diagnosis Date Noted  . Solitary pulmonary nodule 02/06/2016  . Rosacea 11/27/2015  . Osteopenia 11/27/2015  . BMI 34.0-34.9,adult 11/27/2015  . 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 Abdominal  wall mass of left flank 11/13/2015     Health Maintenance Due  Topic Date Due  . TETANUS/TDAP  11/05/1961  . ZOSTAVAX  11/06/2002     Review of Systems  Constitutional: Negative for fever, chills, diaphoresis, activity change, appetite change, fatigue and unexpected weight change.  HENT: Negative for congestion, sore throat, rhinorrhea, sneezing, trouble swallowing and sinus pressure.  Eyes: Negative for photophobia and visual disturbance.  Respiratory: Negative for cough, chest tightness, shortness of breath, wheezing and stridor.  Cardiovascular: Negative for chest pain, palpitations and leg swelling.  Gastrointestinal: Negative for nausea, vomiting, abdominal pain, diarrhea, constipation, blood in stool, abdominal distention and anal bleeding.  Genitourinary: Negative for dysuria, hematuria, flank pain and difficulty urinating.  Musculoskeletal: Negative for myalgias, back pain, joint swelling, arthralgias and gait problem.  Skin: Negative for color change, pallor, rash and wound.  Neurological: Negative for dizziness, tremors, weakness and light-headedness.  Hematological: Negative for adenopathy. Does not bruise/bleed easily.  Psychiatric/Behavioral: Negative for behavioral problems, confusion, sleep disturbance, dysphoric mood, decreased concentration and agitation.    Physical Exam   BP (!) 148/84   Pulse 71   Temp 97.6 F (36.4 C) (Oral)   Ht 5\' 3"  (1.6 m)   Wt 179 lb (81.2 kg)   BMI 31.71 kg/m  Physical Exam  Constitutional:  oriented to person, place, and time. appears well-developed and well-nourished. No distress.  HENT: [place]/AT, PERRLA, no scleral icterus Mouth/Throat: Oropharynx  is clear and moist. No oropharyngeal exudate.  Cardiovascular: Normal rate, regular rhythm and normal heart sounds. Exam reveals no gallop and no friction rub.  No murmur heard.  Pulmonary/Chest: Effort normal and breath sounds normal. No respiratory distress.  has no wheezes.  Neck =  supple, no nuchal rigidity Abdominal: Soft. Bowel sounds are normal.  exhibits no distension. There is no tenderness.  Lymphadenopathy: no cervical adenopathy. No axillary adenopathy Neurological: alert and oriented to person, place, and time.  Skin: Skin is warm and dry. No rash noted. No erythema.  Psychiatric: a normal mood and affect.  behavior is normal.   Testing: 09/13/15: cdiff toxin and antigen positive 02/01/16: cdiff pcr positive  Assessment and Plan  [facility]/ through Dr. Kirstie PeriGary Cox  For now will do QOD taper.

## 2016-04-18 ENCOUNTER — Ambulatory Visit: Payer: Medicare Other | Admitting: Internal Medicine

## 2016-04-22 ENCOUNTER — Telehealth: Payer: Self-pay | Admitting: *Deleted

## 2016-04-22 NOTE — Telephone Encounter (Signed)
Patient had been taking vancomycin one capsule every 3 days, completed 1/7.  On Friday 1/19, her mucous returned. This had been associated with previous bouts of c diff, last noted November 2017.  She is having regular formed BM, but passing mucous in between bowel movements.   On Wednesday 1/24, she is scheduled to have a polyp biopsed/removed in her uterus by her gynecologist.  Please advise if the mucous/stool should be tested. Andree CossHowell, Ayra Hodgdon M, RN

## 2016-04-23 ENCOUNTER — Other Ambulatory Visit: Payer: Self-pay | Admitting: *Deleted

## 2016-04-23 ENCOUNTER — Telehealth: Payer: Self-pay

## 2016-04-23 DIAGNOSIS — A0472 Enterocolitis due to Clostridium difficile, not specified as recurrent: Secondary | ICD-10-CM

## 2016-04-23 MED ORDER — VANCOMYCIN HCL 125 MG PO CAPS: ORAL_CAPSULE | ORAL | 0 refills | Status: DC

## 2016-04-23 NOTE — Progress Notes (Signed)
Verbal [facility] Baxter Flattery. Patient will come today to pick up her stool kit, will go to the [facility] for processing.  Orders faxed for these labs. Patient verbalized understanding, agreement. Landis Gandy, RN

## 2016-04-23 NOTE — Telephone Encounter (Signed)
Pt called stating that she wanted to call and update nurse that her symptoms have gotten worse. She stated that there's more frequency and urges to have bowel movements. There's in increase in her passing mucus in and with her bowel movements as well as mild cramping. Pt tells me that between 12 midnight and 0700 this morning she had 7 bowel/mucus movements and is worried about what to do and how long this will last. Pt states that she has an appointment tomorrow for GYN and is unsure if she should keep appointment or reschedule. I sent a note to Dr.Snider for her to advise on what to do next

## 2016-04-24 ENCOUNTER — Other Ambulatory Visit: Payer: Self-pay | Admitting: Internal Medicine

## 2016-04-24 LAB — C. DIFFICILE GDH AND TOXIN A/B
C. DIFFICILE GDH: DETECTED — AB
C. difficile Toxin A/B: DETECTED — AB

## 2016-04-25 ENCOUNTER — Telehealth: Payer: Self-pay | Admitting: *Deleted

## 2016-04-25 LAB — CLOSTRIDIUM DIFFICILE BY PCR: CDIFFPCR: DETECTED — AB

## 2016-04-25 NOTE — Telephone Encounter (Signed)
[facility] called with positive c diff lab.  RN notified patient, she has started the oral vancomycin. Please advise if/when she should be seen in clinic. Andree CossHowell, Keyontae Huckeby M, RN

## 2016-06-05 ENCOUNTER — Encounter: Payer: Self-pay | Admitting: Internal Medicine

## 2016-06-05 ENCOUNTER — Ambulatory Visit (INDEPENDENT_AMBULATORY_CARE_PROVIDER_SITE_OTHER): Payer: Medicare Other | Admitting: Internal Medicine

## 2016-06-05 VITALS — BP 145/82 | HR 68 | Temp 97.7°F | Wt 175.0 lb

## 2016-06-05 DIAGNOSIS — A0471 Enterocolitis due to Clostridium difficile, recurrent: Secondary | ICD-10-CM | POA: Diagnosis present

## 2016-06-05 MED ORDER — VANCOMYCIN HCL 125 MG PO CAPS: 125.0000 mg | ORAL_CAPSULE | ORAL | 0 refills | Status: DC

## 2016-06-05 NOTE — Progress Notes (Signed)
RFV: recurrent cdifficile  Patient ID: Monica Lamb, female   DOB: 26-Dec-1940, 76 y.o.   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 This is currently on her 4 recurrence of cdiff. She is interested to follow up at [facility] for fecal transplant since her son and daughter in law live in [place] to help with preparation for FMT. She is also interested in doing clinical trials.  She states that she has roughly 1-2 formed BM per day. She denies any abdominal pain or blood n her stool nor has any abdominal pain. She has good appetite, avoid milk products, taking probiotics. She states that her bowel regimen usually goes back to what is considered her baseline after a few days of oral vancomycin.  1/23 + test. Treated with vancomycin taper  Finishing out her QOD dosing. Will do Q 3d x 5 doses to finish out her taper  Outpatient Encounter Prescriptions as of 06/05/2016  Medication Sig  . Cholecalciferol (VITAMIN D PO) Take 1,000 Units by mouth daily.   . metroNIDAZOLE (METROCREAM) 0.75 % cream Apply 1 application topically daily. For rosacea  . Probiotic Product ([place]river) Take by mouth.  . vancomycin (VANCOCIN) 125 MG capsule (continuation of previous taper) take 1 capsule by mouth twice daily for 7 days; then 1 capsule by mouth daily for 7 days; then 1 capsule by mouth every 3 days for 2 weeks.  . vancomycin (VANCOCIN) 125 MG capsule Take 1 capsule 4 times a day for 10 days; then Take 1 capsule 3 times a day for 7 days; then Take 1 capsule 2 times a day for 7 days; then Take 1 capsule 1 time a day for 7 days ;then Take 1 capsule Every other day for 14 days   No facility-administered encounter medications on file as of 06/05/2016.      Patient Active Problem List   Diagnosis Date Noted  . Solitary pulmonary nodule 02/06/2016  . Rosacea 11/27/2015  . Osteopenia 11/27/2015  . BMI 34.0-34.9,adult 11/27/2015  .  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 Abdominal wall mass of left flank 11/13/2015     Health Maintenance Due  Topic Date Due  . TETANUS/TDAP  11/05/1961     Review of Systems As per hpi, 10point  ROS negative Physical Exam   BP (!) 145/82   Pulse 68   Temp 97.7 F (36.5 C) (Oral)   Wt 175 lb (79.4 kg)   BMI 31.00 kg/m   Physical Exam  Constitutional:  oriented to person, place, and time. appears well-developed and well-nourished. No distress.  HENT: [place]/AT, PERRLA, no scleral icterus Mouth/Throat: Oropharynx is clear and moist. No oropharyngeal exudate.  Cardiovascular: Normal rate, regular rhythm and normal heart sounds. Exam reveals no gallop and no friction rub.  No murmur heard.  Pulmonary/Chest: Effort normal and breath sounds normal. No respiratory distress.  has no wheezes.  Neck = supple, no nuchal rigidity Abdominal: Soft. Bowel sounds are normal.  exhibits no distension. There is no tenderness.  Lymphadenopathy: no cervical adenopathy. No axillary adenopathy Neurological: alert and oriented to person, place, and time.  Skin: Skin is warm and dry. No rash noted. No erythema.  Psychiatric: a normal mood and affect.  behavior is normal.     Assessment and Plan Recurrent c.difficile - will prescribe 5 doses of oral vanco 16m Q3days to finish out taper - give another stool kit in case she  has another relapse, at next relapse, may consider fidaxomicin with pulse taper  - see if can refer to [facility] clinical trials to do FMT if next recurrence. Currently only do colonoscopy delivered FMT at our institution.

## 2016-07-03 ENCOUNTER — Other Ambulatory Visit: Payer: Self-pay | Admitting: Obstetrics & Gynecology

## 2016-07-08 ENCOUNTER — Other Ambulatory Visit: Payer: Medicare Other

## 2016-07-08 ENCOUNTER — Telehealth: Payer: Self-pay | Admitting: *Deleted

## 2016-07-08 DIAGNOSIS — R197 Diarrhea, unspecified: Secondary | ICD-10-CM

## 2016-07-08 DIAGNOSIS — A0471 Enterocolitis due to Clostridium difficile, recurrent: Secondary | ICD-10-CM

## 2016-07-08 DIAGNOSIS — Z8619 Personal history of other infectious and parasitic diseases: Secondary | ICD-10-CM

## 2016-07-08 NOTE — Telephone Encounter (Signed)
Patient completed oral vancomycin taper 3/30, diarrhea with mucous on 4/5.  She will bring a stool sample today. Please advise if she should restart vancomycin, if she needs a fidaxomicin with pulse taper, or if she needs referral to [facility] for transplant as discussed at last visit. Andree Coss, RN

## 2016-07-09 ENCOUNTER — Telehealth: Payer: Self-pay | Admitting: *Deleted

## 2016-07-09 LAB — CLOSTRIDIUM DIFFICILE BY PCR: CDIFFPCR: DETECTED — AB

## 2016-07-09 NOTE — Telephone Encounter (Signed)
[facility] labs called to report her recent C Diff test was positive. Will inform the provider.

## 2016-07-11 ENCOUNTER — Other Ambulatory Visit: Payer: Self-pay | Admitting: Internal Medicine

## 2016-07-11 MED ORDER — VANCOMYCIN HCL 125 MG PO CAPS: 125.0000 mg | ORAL_CAPSULE | Freq: Four times a day (QID) | ORAL | 0 refills | Status: DC

## 2016-07-11 NOTE — Progress Notes (Signed)
Recurrent cdifficile. Tested positive a few days ago. Will send in rx for vancomycin

## 2016-07-11 NOTE — Patient Instructions (Signed)
Your procedure is scheduled on: Friday, 5/20  Enter through the Main Entrance of [facility] at: 12 Noon  Pick up the phone at the desk and dial [phone].  Call this number if you have problems the morning of surgery: [phone].  Remember: Do NOT eat food after midnight Thursday, 5/19   Do NOT drink clear liquids after 9:30 am Friday, day of surgery  Take these medicines the morning of surgery with a SIP OF WATER: None  Do NOT wear jewelry (body piercing), metal hair clips/bobby pins, make-up, or nail polish. Do NOT wear lotions, powders, or perfumes.  You may wear deoderant. Do NOT shave for 48 hours prior to surgery. Do NOT bring valuables to the hospital. Contacts, dentures, or bridgework may not be worn into surgery.  Have a responsible adult drive you home and stay with you for 24 hours after your procedure.

## 2016-07-11 NOTE — Telephone Encounter (Signed)
Can you call her to make sure she got the message I left today to start oral vancomycin

## 2016-07-12 ENCOUNTER — Inpatient Hospital Stay (HOSPITAL_COMMUNITY)
Admission: RE | Admit: 2016-07-12 | Discharge: 2016-07-12 | Disposition: A | Discharge status: Home / Self Care | Payer: Medicare Other | Source: Ambulatory Visit

## 2016-07-12 NOTE — Pre-Procedure Instructions (Signed)
Patient no showed for 1130 am PAT appointment today.  Pam in admitting called patient and was told that the surgery was cancelled.  Patient stated she spoke with MD's office.  I  Called Shanelle to confirm.  LMOM.

## 2016-07-15 ENCOUNTER — Telehealth: Payer: Self-pay | Admitting: *Deleted

## 2016-07-15 NOTE — Telephone Encounter (Signed)
Called patient to check on her and make sure she picked up and started medication. Had to leave a message for her to call the office.

## 2016-07-15 NOTE — Telephone Encounter (Signed)
Per request from Dr Drue Second, RN left message at [facility] for Dr Kirstie Peri, requesting information on how to refer patient to any c diff clinical trials they may have going on at this time.  [facility] Nurse Triage line [phone].  Patient notified.  She would prefer any transplant to be done at [facility] due to her support network there (she does not have family here in [place]). Monica Lamb has picked up her prescription, started taking it without issue 4/13.  She had a rough  weekend, is still far from normal, but is starting to feel better.  Andree Coss, RN

## 2016-07-19 ENCOUNTER — Encounter (HOSPITAL_COMMUNITY): Admission: RE | Payer: Self-pay | Source: Ambulatory Visit

## 2016-07-19 ENCOUNTER — Ambulatory Visit (HOSPITAL_COMMUNITY)
Admission: RE | Admit: 2016-07-19 | Payer: Medicare Other | Source: Ambulatory Visit | Admitting: Obstetrics & Gynecology

## 2016-07-19 SURGERY — DILATATION & CURETTAGE/HYSTEROSCOPY WITH MYOSURE
Anesthesia: Choice

## 2016-07-19 NOTE — Telephone Encounter (Signed)
Records faxed to [facility] for consideration by Dr Sedalia Muta, attention [place].   F: [phone]. Please page Dr Sedalia Muta at your convenience to discuss this referral: [phone]. Thank you!

## 2016-07-25 ENCOUNTER — Ambulatory Visit (INDEPENDENT_AMBULATORY_CARE_PROVIDER_SITE_OTHER): Payer: Medicare Other | Admitting: Internal Medicine

## 2016-07-25 ENCOUNTER — Encounter: Payer: Self-pay | Admitting: Internal Medicine

## 2016-07-25 VITALS — BP 138/79 | HR 66 | Temp 98.0°F | Wt 173.0 lb

## 2016-07-25 DIAGNOSIS — A0471 Enterocolitis due to Clostridium difficile, recurrent: Secondary | ICD-10-CM

## 2016-07-25 LAB — CBC WITH DIFFERENTIAL/PLATELET
Basophils Absolute: 80 cells/uL (ref 0–200)
Basophils Relative: 1 %
Eosinophils Absolute: 80 cells/uL (ref 15–500)
Eosinophils Relative: 1 %
HEMATOCRIT: 41.3 % (ref 35.0–45.0)
HEMOGLOBIN: 13.2 g/dL (ref 11.7–15.5)
LYMPHS ABS: 2160 {cells}/uL (ref 850–3900)
Lymphocytes Relative: 27 %
MCH: 27 pg (ref 27.0–33.0)
MCHC: 32 g/dL (ref 32.0–36.0)
MCV: 84.6 fL (ref 80.0–100.0)
MONO ABS: 560 {cells}/uL (ref 200–950)
MPV: 9.8 fL (ref 7.5–12.5)
Monocytes Relative: 7 %
NEUTROS ABS: 5120 {cells}/uL (ref 1500–7800)
Neutrophils Relative %: 64 %
Platelets: 345 10*3/uL (ref 140–400)
RBC: 4.88 MIL/uL (ref 3.80–5.10)
RDW: 14.4 % (ref 11.0–15.0)
WBC: 8 10*3/uL (ref 3.8–10.8)

## 2016-07-25 LAB — COMPLETE METABOLIC PANEL WITH GFR
AG Ratio: 1.4 Ratio (ref 1.0–2.5)
ALBUMIN: 3.7 g/dL (ref 3.6–5.1)
ALT: 10 U/L (ref 6–29)
AST: 15 U/L (ref 10–35)
Alkaline Phosphatase: 71 U/L (ref 33–130)
BUN/Creatinine Ratio: 22.4 Ratio — ABNORMAL HIGH (ref 6–22)
BUN: 15 mg/dL (ref 7–25)
CALCIUM: 9.2 mg/dL (ref 8.6–10.4)
CHLORIDE: 105 mmol/L (ref 98–110)
CO2: 27 mmol/L (ref 20–31)
CREATININE: 0.67 mg/dL (ref 0.60–0.93)
GFR, Est African American: >89 mL/min (ref >=60)
GFR, Est Non African American: 87 mL/min (ref >=60)
Globulin: 2.7 g/dL (ref 1.9–3.7)
Glucose, Bld: 79 mg/dL (ref 65–99)
POTASSIUM: 4 mmol/L (ref 3.5–5.3)
SODIUM: 141 mmol/L (ref 135–146)
Total Bilirubin: 0.3 mg/dL (ref 0.2–1.2)
Total Protein: 6.4 g/dL (ref 6.1–8.1)

## 2016-07-25 MED ORDER — VANCOMYCIN HCL 125 MG PO CAPS: 125.0000 mg | ORAL_CAPSULE | Freq: Four times a day (QID) | ORAL | 1 refills | Status: DC

## 2016-07-25 NOTE — Progress Notes (Signed)
    Patient ID: CAELA HUOT, female   DOB: 26-Mar-1941, 76 y.o.   MRN: [id number]  HPI 75yo F with relapsed cdifficile. We last saw her in early march where she finished out her vanco taper. She was having only 1-2 BM while on every 3 day dosing that ended on 3/30. She then started to have mucosy stools and watery diarrhea on 4/5. Her tests were positive again on 4/9. She was started on oral vanco at QID dosing and her bowel movemnts quickly returned back to normal. She would like to be referred to [facility] since she has family members to help her with getting to the appointment.  Her past positive tests:  4/9 cdiff pcr + (gdh/toxin also ordered but not done) 1/24 cdiff pcr +/gdh+/toxin+ 11/2 cdiff pcr+ 09/13/15 cdiff antigen+/toxin+  Bowel movements roughly 2-3 per day, normal. Semi formed stool. No abdominal cramping.   Outpatient Encounter Prescriptions as of 07/25/2016  Medication Sig  . acetaminophen (TYLENOL) 500 MG tablet Take 500 mg by mouth every 6 (six) hours as needed for mild pain or headache.  . cholecalciferol (VITAMIN D) 1000 units tablet Take 1,000 Units by mouth daily.  . metroNIDAZOLE (METROCREAM) 0.75 % cream Apply 1 application topically daily. For rosacea  . Phenylephrine-Acetaminophen (TYLENOL SINUS+HEADACHE PO) Take 1 tablet by mouth daily as needed (SINUS HEADACHE).  Bertram Gala Glycol-Propyl Glycol (SYSTANE OP) Apply 1 drop to eye daily as needed (DRY EYES).  . Probiotic Product (ULTRAFLORA IMMUNE HEALTH PO) Take 1 tablet by mouth daily.   . vancomycin (VANCOCIN) 125 MG capsule Take 1 capsule (125 mg total) by mouth every 3 (three) days.  . vancomycin (VANCOCIN) 125 MG capsule Take 1 capsule (125 mg total) by mouth 4 (four) times daily.   No facility-administered encounter medications on file as of 07/25/2016.      Patient Active Problem List   Diagnosis Date Noted  . Solitary pulmonary nodule 02/06/2016  . Rosacea 11/27/2015  . Osteopenia 11/27/2015  . BMI  34.0-34.9,adult 11/27/2015  . 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 Abdominal wall mass of left flank 11/13/2015     Health Maintenance Due  Topic Date Due  . TETANUS/TDAP  11/05/1961     Review of Systems #4-5 lb in a week but now recovered, no blood in stool, no abdominal cramping or flatulence. Some occasional bloating. Otherwise 12 point ros is negative  Physical Exam   BP 138/79   Pulse 66   Temp 98 F (36.7 C) (Oral)   Wt 173 lb (78.5 kg)   BMI 30.65 kg/m  gen = fatigued appearing heent = MMM, OP is clear abd= + BS, soft, NTND Ext= no c/c/e  Assessment and Plan Recurrent cdifficile = we will continue her on QID dosing in anticipation of scheduling her FMT. I have coordinated with dr Leone Payor that likely first week of May could work  Plan to stop oral vanco 2 days prior to FMT. On day before procedure, plan oral bowel prep. On day of procedure, will need to take  of immodium as well as same dose after the procedure   Fatigue = unsure if this is the sequelae of repeated bouts of cdifficile vs anemia,   wil check cbc and cmp  Some of the following dates would work: May 8th, Monday 14th, Tuesday may 15th.   Spent 30 min coordination of care, counseling on cdiff/FMT process

## 2016-07-29 ENCOUNTER — Telehealth: Payer: Self-pay

## 2016-07-29 ENCOUNTER — Other Ambulatory Visit: Payer: Self-pay

## 2016-07-29 DIAGNOSIS — A0472 Enterocolitis due to Clostridium difficile, not specified as recurrent: Secondary | ICD-10-CM

## 2016-07-29 NOTE — Telephone Encounter (Signed)
Neither facility could acomodate a 12:00 case, [facility] has no time that day at all.  Gerri Spore could do an 11:30 at the latest on May 8.  I put her on for that. She will come for a pre-visit on 08/01/16 1:00.

## 2016-07-29 NOTE — Addendum Note (Signed)
Addended by: Annett Fabian on: 07/29/2016 10:53 AM   Modules accepted: Orders

## 2016-07-29 NOTE — Telephone Encounter (Signed)
-----   Message from Iva Boop, MD sent at 07/29/2016  7:34 AM EDT ----- Monica Lamb to set it up noonish at [facility] or [facility]  Have to see if something available and will do it  [facility] - please see what we can do - am at [place] that AM but could stop earlier than usual and move to [facility] - suspect they may not be able to give [place] at noon  Am ok to do moderate sedation   If we can get the time she will need a previsit  CEG ----- Message ----- From: Judyann Munson, MD Sent: 07/26/2016  12:09 PM To: Iva Boop, MD  I thought I had emailed you back but may have forgotten.  - can we do a noon time procedure. Either facility would work to make it easy for you. May 8th will be perfect. I will let her know that is the day to anticipate having it done. I will get FMT ordered from [facility].  Can she be seen by your NP prior to the procedure? She has never had colonoscopy before  ----- Message ----- From: Iva Boop, MD Sent: 07/25/2016   2:59 PM To: Judyann Munson, MD  I have time - am scoping at [facility] on May 8 and if absolutely necessary I could also come to [facility] in late AM ----- Message ----- From: Judyann Munson, MD Sent: 07/25/2016   2:21 PM To: Iva Boop, MD  Hi! Any chance you can help me get an FMT done. Do any of these dates work for you? Or do you know who is on call for that week that can help me  May 8th, may 14th or may 15th  Lets get dinner with our SOs sometime soon.

## 2016-08-01 ENCOUNTER — Telehealth: Payer: Self-pay | Admitting: Internal Medicine

## 2016-08-01 ENCOUNTER — Ambulatory Visit (AMBULATORY_SURGERY_CENTER): Payer: Self-pay | Admitting: *Deleted

## 2016-08-01 VITALS — Ht 63.0 in | Wt 173.0 lb

## 2016-08-01 DIAGNOSIS — A0471 Enterocolitis due to Clostridium difficile, recurrent: Secondary | ICD-10-CM

## 2016-08-01 NOTE — Progress Notes (Signed)
No egg or soy allergy known to patient  No issues with past sedation with any surgeries  or procedures, no intubation problems  No diet pills per patient No home 02 use per patient  No blood thinners per patient  Pt denies issues with constipation  No A fib or A flutter  EMMI video sent to pt's e mail  

## 2016-08-01 NOTE — Telephone Encounter (Signed)
Pt questioned about fruits- states she eats blueberries and grapes every day- instructed to avoid these and eat only fruits she can peel. She asked if she could eat corn tortillas since cannot have corn and told yes, call back with further questions  Monica Lamb PV

## 2016-08-05 ENCOUNTER — Ambulatory Visit (INDEPENDENT_AMBULATORY_CARE_PROVIDER_SITE_OTHER): Payer: Medicare Other | Admitting: Family Medicine

## 2016-08-05 ENCOUNTER — Encounter: Payer: Self-pay | Admitting: Family Medicine

## 2016-08-05 VITALS — BP 108/64 | HR 70 | Temp 97.9°F | Ht 63.0 in | Wt 172.9 lb

## 2016-08-05 DIAGNOSIS — A0471 Enterocolitis due to Clostridium difficile, recurrent: Secondary | ICD-10-CM | POA: Diagnosis not present

## 2016-08-05 DIAGNOSIS — Z6834 Body mass index (BMI) 34.0-34.9, adult: Secondary | ICD-10-CM

## 2016-08-05 DIAGNOSIS — R911 Solitary pulmonary nodule: Secondary | ICD-10-CM

## 2016-08-05 DIAGNOSIS — M8580 Other specified disorders of bone density and structure, unspecified site: Secondary | ICD-10-CM | POA: Diagnosis not present

## 2016-08-05 NOTE — Patient Instructions (Addendum)
BEFORE YOU LEAVE: -follow up: Medicare exam with susan in November Follow up with Dr. Selena BattenKim in 1 year  Please call to set up your CT when you are able.  Please call number provided about the bone density at your convenience.  Hang in there! I hope you are feeling better soon!    We recommend the following healthy lifestyle for LIFE: 1) Small portions.   Tip: eat off of a salad plate instead of a dinner plate.  Tip: if you need more or a snack choose fruits, veggies and/or a handful of nuts or seeds.  2) Eat a healthy clean diet.  * Tip: Avoid (less then 1 serving per week): processed foods, sweets, sweetened drinks, white starches (rice, flour, bread, potatoes, pasta, etc), red meat, fast foods, butter  *Tip: CHOOSE instead   * 5-9 servings per day of fresh or frozen fruits and vegetables (but not corn, potatoes, bananas, canned or dried fruit)   *nuts and seeds, beans   *olives and olive oil   *small portions of lean meats such as fish and white chicken    *small portions of whole grains  3)Get at least 150 minutes of sweaty aerobic exercise per week.  4)Reduce stress - consider counseling, meditation and relaxation to balance other aspects of your life.

## 2016-08-05 NOTE — Progress Notes (Signed)
HPI:  Follow up:  Hx Osteopenia: -s/p 2 years fosamax in the past -used to see Dr. Horald PollenBalen -ordered repeat DEXA last visit - report nobody ever called her -no falls  Pulm nodule: -hx 2nd hand smoke exposure, sister with lung Ca (non-smoker) -ordered CT for this Aug 2018 per pt preference and hx -reports she will schedule, has number -no COB, cough, hemoptysis  Recurrent C. Diff infections: -seeing ID -undergoing fecal transplant tomorrow  Sees Dr. Juliene PinaMody for gyn care.  ROS: See pertinent positives and negatives per HPI.  Past Medical History:  Diagnosis Date  . Allergy   . C. difficile diarrhea   . Cataract    removed x 2   . Glaucoma   . Osteopenia    took fosomax remotely for a few years- past hx-  . Rosacea    -had surgery with Dr. Roda Shuttersaswell remortely; sees Dr. Dagoberto LigasStoneburner    Past Surgical History:  Procedure Laterality Date  . CATARACT EXTRACTION, BILATERAL    . LYMPH NODE BIOPSY     benign per patient  . lymphnode biopsy    . TUBAL LIGATION      Family History  Problem Relation Age of Onset  . Arthritis Mother   . Hypertension Mother   . Macular degeneration Mother   . Cancer Father   . Lung cancer Father   . Arthritis Sister   . Hypertension Sister   . Cancer Maternal Aunt   . Lung cancer Sister   . Colon cancer Neg Hx   . Colon polyps Neg Hx   . Esophageal cancer Neg Hx   . Stomach cancer Neg Hx   . Rectal cancer Neg Hx     Social History   Social History  . Marital status: Widowed    Spouse name: N/A  . Number of children: N/A  . Years of education: N/A   Social History Main Topics  . Smoking status: Never Smoker  . Smokeless tobacco: Never Used  . Alcohol use No  . Drug use: No  . Sexual activity: Not Asked   Other Topics Concern  . 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  acetaminophen (TYLENOL) 500 MG tablet, Take 500 mg by mouth every 6 (six) hours as needed for mild pain or headache., Disp: , Rfl:  .  bisacodyl (DULCOLAX) 5 MG EC tablet, Take 5 mg by mouth once. For colonoscopy on 08/06/16, Disp: , Rfl:  .  cholecalciferol (VITAMIN D) 1000 units tablet, Take 1,000 Units by mouth daily., Disp: , Rfl:  .  loperamide (IMODIUM) 2 MG capsule, Take 8 mg by mouth once. X 4 for colon 08-06-16 , Disp: , Rfl:  .  metroNIDAZOLE (METROCREAM) 0.75 % cream, Apply 1 application topically daily. For rosacea, Disp: , Rfl:  .  Phenylephrine-Acetaminophen (TYLENOL SINUS+HEADACHE PO), Take 1 tablet by mouth daily as needed (SINUS HEADACHE)., Disp: , Rfl:  .  Polyethyl Glycol-Propyl Glycol (SYSTANE OP), Apply 1 drop to eye daily as needed (DRY EYES)., Disp: , Rfl:  .  polyethylene glycol powder (MIRALAX) powder, Take 1 Container by  mouth once. For colon w/fecal transplant 08-06-16, Disp: , Rfl:  .  Probiotic Product (ULTRAFLORA IMMUNE HEALTH PO), Take 1 tablet by mouth daily. , Disp: , Rfl:   EXAM:  Vitals:   08/05/16 1255  BP: 108/64  Pulse: 70  Temp: 97.9 F (36.6 C)    Body mass index is 30.63 kg/m.  GENERAL: vitals reviewed and listed above, alert, oriented, appears well hydrated and in no acute distress  HEENT: atraumatic, conjunttiva clear, no obvious abnormalities on inspection of external nose and ears  NECK: no obvious masses on inspection  LUNGS: clear to auscultation bilaterally, no wheezes, rales or rhonchi, good air movement  CV: HRRR, no peripheral edema  MS: moves all extremities without noticeable abnormality  PSYCH: pleasant and cooperative, no obvious depression or anxiety  ASSESSMENT AND PLAN:  Discussed the following assessment and plan:  Solitary pulmonary nodule  Osteopenia,  unspecified location  BMI 34.0-34.9,adult  Recurrent Clostridium difficile diarrhea  -see pt instructions -assistant to help with scheduling orders placed last visit -lifestyle recs -medicare exam this fall -Patient advised to return or notify a doctor immediately if symptoms worsen or persist or new concerns arise.  Patient Instructions  BEFORE YOU LEAVE: -follow up: Medicare exam with susan in November Follow up with Dr. Selena Batten in 1 year  Please call to set up your CT when you are able.  Please call number provided about the bone density at your convenience.  Hang in there! I hope you are feeling better soon!    We recommend the following healthy lifestyle for LIFE: 1) Small portions.   Tip: eat off of a salad plate instead of a dinner plate.  Tip: if you need more or a snack choose fruits, veggies and/or a handful of nuts or seeds.  2) Eat a healthy clean diet.  * Tip: Avoid (less then 1 serving per week): processed foods, sweets, sweetened drinks, white starches (rice, flour, bread, potatoes, pasta, etc), red meat, fast foods, butter  *Tip: CHOOSE instead   * 5-9 servings per day of fresh or frozen fruits and vegetables (but not corn, potatoes, bananas, canned or dried fruit)   *nuts and seeds, beans   *olives and olive oil   *small portions of lean meats such as fish and white chicken    *small portions of whole grains  3)Get at least 150 minutes of sweaty aerobic exercise per week.  4)Reduce stress - consider counseling, meditation and relaxation to balance other aspects of your life.     Kriste Basque R., DO

## 2016-08-05 NOTE — Progress Notes (Signed)
Pre visit review using our clinic review tool, if applicable. No additional management support is needed unless otherwise documented below in the visit note. 

## 2016-08-06 ENCOUNTER — Ambulatory Visit (HOSPITAL_COMMUNITY): Payer: Medicare Other | Admitting: Anesthesiology

## 2016-08-06 ENCOUNTER — Ambulatory Visit (HOSPITAL_COMMUNITY)
Admission: RE | Admit: 2016-08-06 | Discharge: 2016-08-06 | Disposition: A | Discharge status: Home / Self Care | Payer: Medicare Other | Source: Ambulatory Visit | Attending: Internal Medicine | Admitting: Internal Medicine

## 2016-08-06 ENCOUNTER — Encounter (HOSPITAL_COMMUNITY): Payer: Self-pay | Admitting: Internal Medicine

## 2016-08-06 ENCOUNTER — Encounter (HOSPITAL_COMMUNITY)
Admission: RE | Disposition: A | Discharge status: Home / Self Care | Payer: Self-pay | Source: Ambulatory Visit | Attending: Internal Medicine

## 2016-08-06 DIAGNOSIS — Z880 Allergy status to penicillin: Secondary | ICD-10-CM | POA: Diagnosis not present

## 2016-08-06 DIAGNOSIS — A0472 Enterocolitis due to Clostridium difficile, not specified as recurrent: Secondary | ICD-10-CM | POA: Diagnosis not present

## 2016-08-06 DIAGNOSIS — Z79899 Other long term (current) drug therapy: Secondary | ICD-10-CM | POA: Insufficient documentation

## 2016-08-06 DIAGNOSIS — Z881 Allergy status to other antibiotic agents status: Secondary | ICD-10-CM | POA: Insufficient documentation

## 2016-08-06 DIAGNOSIS — A0471 Enterocolitis due to Clostridium difficile, recurrent: Secondary | ICD-10-CM | POA: Diagnosis present

## 2016-08-06 HISTORY — PX: COLONOSCOPY: SHX5424

## 2016-08-06 HISTORY — PX: FECAL TRANSPLANT: SHX6383

## 2016-08-06 SURGERY — COLONOSCOPY
Anesthesia: Monitor Anesthesia Care

## 2016-08-06 MED ORDER — LOPERAMIDE HCL 2 MG PO CAPS
4.0000 mg | ORAL_CAPSULE | Freq: Once | ORAL | Status: AC
  Administered 2016-08-06: 4 mg via ORAL
  Filled 2016-08-06: qty 2

## 2016-08-06 MED ORDER — LIDOCAINE 2% (20 MG/ML) 5 ML SYRINGE
INTRAMUSCULAR | Status: AC
  Filled 2016-08-06: qty 5

## 2016-08-06 MED ORDER — LIDOCAINE 2% (20 MG/ML) 5 ML SYRINGE
INTRAMUSCULAR | Status: DC | PRN
  Administered 2016-08-06: 40 mg via INTRAVENOUS

## 2016-08-06 MED ORDER — PROPOFOL 10 MG/ML IV BOLUS
INTRAVENOUS | Status: AC
  Filled 2016-08-06: qty 40

## 2016-08-06 MED ORDER — PROPOFOL 10 MG/ML IV BOLUS
INTRAVENOUS | Status: DC | PRN
  Administered 2016-08-06: 30 mg via INTRAVENOUS

## 2016-08-06 MED ORDER — LACTATED RINGERS IV SOLN
INTRAVENOUS | Status: DC
  Administered 2016-08-06: 1000 mL via INTRAVENOUS

## 2016-08-06 MED ORDER — SODIUM CHLORIDE 0.9 % IV SOLN: INTRAVENOUS | Status: DC

## 2016-08-06 MED ORDER — PROPOFOL 500 MG/50ML IV EMUL
INTRAVENOUS | Status: DC | PRN
  Administered 2016-08-06: 125 ug/kg/min via INTRAVENOUS

## 2016-08-06 NOTE — Anesthesia Preprocedure Evaluation (Addendum)
Anesthesia Evaluation  Patient identified by MRN, date of birth, ID band Patient awake    Reviewed: Allergy & Precautions, NPO status , Patient's Chart, lab work & pertinent test results  History of Anesthesia Complications Negative for: history of anesthetic complications  Airway Mallampati: II  TM Distance: >3 FB Neck ROM: Full    Dental no notable dental hx. (+) Dental Advisory Given   Pulmonary neg pulmonary ROS,    Pulmonary exam normal        Cardiovascular negative cardio ROS Normal cardiovascular exam     Neuro/Psych negative neurological ROS  negative psych ROS   GI/Hepatic Neg liver ROS,   Endo/Other  negative endocrine ROS  Renal/GU negative Renal ROS  negative genitourinary   Musculoskeletal negative musculoskeletal ROS (+)   Abdominal   Peds negative pediatric ROS (+)  Hematology negative hematology ROS (+)   Anesthesia Other Findings   Reproductive/Obstetrics negative OB ROS                            Anesthesia Physical Anesthesia Plan  ASA: II  Anesthesia Plan: MAC   Post-op Pain Management:    Induction:   Airway Management Planned: Natural Airway and Simple Face Mask  Additional Equipment:   Intra-op Plan:   Post-operative Plan:   Informed Consent: I have reviewed the patients History and Physical, chart, labs and discussed the procedure including the risks, benefits and alternatives for the proposed anesthesia with the patient or authorized representative who has indicated his/her understanding and acceptance.   Dental advisory given  Plan Discussed with: CRNA and Anesthesiologist  Anesthesia Plan Comments:        Anesthesia Quick Evaluation

## 2016-08-06 NOTE — Transfer of Care (Signed)
Immediate Anesthesia Transfer of Care Note  Patient: Monica Lamb  Procedure(s) Performed: Procedure(s): COLONOSCOPY (N/A) FECAL TRANSPLANT (N/A)  Patient Location: PACU and Endoscopy Unit  Anesthesia Type:MAC  Level of Consciousness: awake, alert  and patient cooperative  Airway & Oxygen Therapy: Patient Spontanous Breathing and Patient connected to face mask oxygen  Post-op Assessment: Report given to RN and Post -op Vital signs reviewed and stable  Post vital signs: Reviewed and stable  Last Vitals:  Vitals:   08/06/16 1049  BP: (!) 141/51  Pulse: 84  Resp: 16  Temp: 36.5 C    Last Pain:  Vitals:   08/06/16 1049  TempSrc: Oral         Complications: No apparent anesthesia complications

## 2016-08-06 NOTE — H&P (Signed)
[facility] History and Physical   Primary Care Physician:  Terressa KoyanagiKim, Hannah R, DO   Reason for Procedure:   Fecal transplant  Plan:    Colonoscopy and fecal transplant The risks and benefits as well as alternatives of endoscopic procedure(s) have been discussed and reviewed. All questions answered. The patient agrees to proceed.      HPI: Monica BushmanSharon O Lamb is a 76 y.o. female with recurrent C diff colitis - asymptomatic now but has recently been retreated with vancomycin. Planned for fecal microbiotica transplant today.   Past Medical History:  Diagnosis Date  . Allergy   . C. difficile diarrhea   . Cataract    removed x 2   . Glaucoma   . Osteopenia    took fosomax remotely for a few years- past hx-  . Rosacea    -had surgery with Dr. Roda Shuttersaswell remortely; sees Dr. Dagoberto LigasStoneburner    Past Surgical History:  Procedure Laterality Date  . CATARACT EXTRACTION, BILATERAL    . LYMPH NODE BIOPSY     benign per patient  . lymphnode biopsy    . TUBAL LIGATION      Prior to Admission medications   Medication Sig Start Date End Date Taking? Authorizing Provider  acetaminophen (TYLENOL) 500 MG tablet Take 500 mg by mouth every 6 (six) hours as needed for mild pain or headache.   Yes [provider]  bisacodyl (DULCOLAX) 5 MG EC tablet Take 5 mg by mouth once. For colonoscopy on 08/06/16   Yes [provider]  cholecalciferol (VITAMIN D) 1000 units tablet Take 1,000 Units by mouth daily.   Yes [provider]  loperamide (IMODIUM) 2 MG capsule Take 8 mg by mouth once. X 4 for colon 08-06-16    Yes [provider]  metroNIDAZOLE (METROCREAM) 0.75 % cream Apply 1 application topically daily. For rosacea   Yes [provider]  Phenylephrine-Acetaminophen (TYLENOL SINUS+HEADACHE PO) Take 1 tablet by mouth daily as needed (SINUS HEADACHE).   Yes [provider]  Polyethyl Glycol-Propyl Glycol (SYSTANE OP) Apply 1 drop to eye daily as  needed (DRY EYES).   Yes [provider]  polyethylene glycol powder (MIRALAX) powder Take 1 Container by mouth once. For colon w/fecal transplant 08-06-16   Yes [provider]  Probiotic Product (ULTRAFLORA IMMUNE HEALTH PO) Take 1 tablet by mouth daily.    Yes [provider]    Current Facility-Administered Medications  Medication Dose Route Frequency Provider Last Rate Last Dose  . 0.9 %  sodium chloride infusion   Intravenous Continuous Iva BoopGessner, Lory Nowaczyk E, MD      . lactated ringers infusion   Intravenous Continuous Iva BoopGessner, Saman Umstead E, MD 10 mL/hr at 08/06/16 1107 1,000 mL at 08/06/16 1107    Allergies as of 07/29/2016 - Review Complete 07/25/2016  Allergen Reaction Noted  . Clindamycin/lincomycin  11/27/2015  . Penicillins Rash 09/13/2015    Family History  Problem Relation Age of Onset  . Arthritis Mother   . Hypertension Mother   . Macular degeneration Mother   . Cancer Father   . Lung cancer Father   . Arthritis Sister   . Hypertension Sister   . Cancer Maternal Aunt   . Lung cancer Sister   . Colon cancer Neg Hx   . Colon polyps Neg Hx   . Esophageal cancer Neg Hx   . Stomach cancer Neg Hx   . Rectal cancer Neg Hx     Social History   Social  History  . Marital status: Widowed    Spouse name: N/A  . Number of children: N/A  . Years of education: N/A   Occupational History  . Not on file.   Social History Main Topics  . Smoking status: Never Smoker  . Smokeless tobacco: Never Used  . Alcohol use No  . Drug use: No  . Sexual activity: Not on file   Other Topics Concern  . 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  Physical Exam: Vital signs in last 24 hours: Temp:  [97.7 F (36.5 C)] 97.7 F (36.5 C) (05/08 1049) Pulse Rate:  [84] 84 (05/08 1049) Resp:  [16] 16 (05/08 1049) BP: (141)/(51) 141/51 (05/08 1049) SpO2:  [99 %] 99 % (05/08 1049)   General:   Alert,  Well-developed, well-nourished, pleasant and cooperative in NAD Lungs:  Clear throughout to auscultation.   Heart:  Regular rate and rhythm; no murmurs, clicks, rubs,  or gallops. Abdomen:  Soft, nontender and nondistended. Normal bowel sounds.   Neuro/Psych:  Alert and cooperative. Normal mood and affect. A and O x 3   @Cace Osorto  Sena Slate, MD, [facility] [phone] (pager) 08/06/2016 12:17 PM@

## 2016-08-06 NOTE — Anesthesia Postprocedure Evaluation (Addendum)
Anesthesia Post Note  Patient: Monica Lamb  Procedure(s) Performed: Procedure(s) (LRB): COLONOSCOPY (N/A) FECAL TRANSPLANT (N/A)  Patient location during evaluation: Endoscopy Anesthesia Type: MAC Level of consciousness: awake and alert Pain management: pain level controlled Vital Signs Assessment: post-procedure vital signs reviewed and stable Respiratory status: spontaneous breathing and respiratory function stable Cardiovascular status: stable Anesthetic complications: no       Last Vitals:  Vitals:   08/06/16 1300 08/06/16 1306  BP: (!) 120/54   Pulse: 81 76  Resp: (!) 21 (!) 6  Temp:      Last Pain:  Vitals:   08/06/16 1256  TempSrc: Oral                 Charlaine Utsey DANIEL

## 2016-08-06 NOTE — Discharge Instructions (Signed)
° °  The procedure was successful. I did not see any polyps or tumors though this colonoscopy was not designed to look carefully.  Please contact me or Dr. Drue SecondSnider with any questions.  I hope this works and lasts!  I appreciate the opportunity to care for you. Iva Booparl E. Harly Pipkins, MD, FACG  YOU HAD AN ENDOSCOPIC PROCEDURE TODAY: Refer to the procedure report and other information in the discharge instructions given to you for any specific questions about what was found during the examination. If this information does not answer your questions, please call Dr. Marvell FullerGessner's office at [phone] to clarify.   YOU SHOULD EXPECT: Some feelings of bloating in the abdomen. Passage of more gas than usual. Walking can help get rid of the air that was put into your GI tract during the procedure and reduce the bloating. If you had a lower endoscopy (such as a colonoscopy or flexible sigmoidoscopy) you may notice spotting of blood in your stool or on the toilet paper. Some abdominal soreness may be present for a day or two, also.  DIET: Your first meal following the procedure should be a light meal and then it is ok to progress to your normal diet. A half-sandwich or bowl of soup is an example of a good first meal. Heavy or fried foods are harder to digest and may make you feel nauseous or bloated. Drink plenty of fluids but you should avoid alcoholic beverages for 24 hours.   ACTIVITY: Your care partner should take you home directly after the procedure. You should plan to take it easy, moving slowly for the rest of the day. You can resume normal activity the day after the procedure however YOU SHOULD NOT DRIVE, use power tools, machinery or perform tasks that involve climbing or major physical exertion for 24 hours (because of the sedation medicines used during the test).   SYMPTOMS TO REPORT IMMEDIATELY: A gastroenterologist can be reached at any hour. Please call [phone]  for any of the following  symptoms:  Following lower endoscopy (colonoscopy, flexible sigmoidoscopy) Excessive amounts of blood in the stool  Significant tenderness, worsening of abdominal pains  Swelling of the abdomen that is new, acute  Fever of 100 or higher

## 2016-08-06 NOTE — Op Note (Signed)
[facility] Patient Name: Monica Lamb Procedure Date: 08/06/2016 MRN: [id number] Attending MD: Iva Boop , MD Date of Birth: 05/29/1940 CSN: [id number] Age: 76 Admit Type: Outpatient Procedure:                Colonoscopy Indications:              Fecal transplant for treatment of recurrent                            Clostridium difficile colitis Providers:                Iva Boop, MD, Omelia Blackwater RN, RN,                            Kandice Robinsons, Technician, Margo Aye,                            Technician Referring MD:              Medicines:                Propofol per Anesthesia, Monitored Anesthesia Care Complications:            No immediate complications. Estimated Blood Loss:     Estimated blood loss: none. Procedure:                Pre-Anesthesia Assessment:                           - Prior to the procedure, a History and Physical                            was performed, and patient medications and                            allergies were reviewed. The patient's tolerance of                            previous anesthesia was also reviewed. The risks                            and benefits of the procedure and the sedation                            options and risks were discussed with the patient.                            All questions were answered, and informed consent                            was obtained. Prior Anticoagulants: The patient has                            taken no previous anticoagulant or antiplatelet                            agents. ASA Grade  Assessment: II - A patient with                            mild systemic disease. After reviewing the risks                            and benefits, the patient was deemed in                            satisfactory condition to undergo the procedure.                           After obtaining informed consent, the colonoscope                            was passed under  direct vision. Throughout the                            procedure, the patient's blood pressure, pulse, and                            oxygen saturations were monitored continuously. The                            EC-[id number] ([id number]) scope was introduced through                            the anus and advanced to the the cecum, identified                            by appendiceal orifice and ileocecal valve. The                            colonoscopy was performed without difficulty. The                            patient tolerated the procedure well. The quality                            of the bowel preparation was good. The bowel                            preparation used was Miralax. The terminal ileum,                            ileocecal valve, appendiceal orifice, and rectum                            were photographed. Scope In: 12:44:39 PM Scope Out: 12:49:12 PM Scope Withdrawal Time: 0 hours 0 minutes 31 seconds  Total Procedure Duration: 0 hours 4 minutes 33 seconds  Findings:      The perianal and digital rectal examinations were normal.      The colon (entire examined portion) appeared normal. Limited exam - not  planned for polyp inspection but to deliver FMT .      The terminal ileum appeared normal. 300 cc Open Biome FMT material       injected and placed into normal terminal ileum Impression:               - The entire examined colon is normal.                           - The examined portion of the ileum was normal.                           - No specimens collected. successful FMT procedure Moderate Sedation:      N/A- Per Anesthesia Care Recommendation:           - Patient has a contact number available for                            emergencies. The signs and symptoms of potential                            delayed complications were discussed with the                            patient. Return to normal activities tomorrow.                             Written discharge instructions were provided to the                            patient.                           - Resume previous diet.                           - Continue present medications.                           - Stop Probiotic                           - No recommendation at this time regarding repeat                            colonoscopy. Procedure Code(s):        --- Professional ---                           [phone], Colonoscopy, flexible; diagnostic, including                            collection of specimen(s) by brushing or washing,                            when performed (separate procedure) Diagnosis Code(s):        --- Professional ---  A04.7, Enterocolitis due to Clostridium difficile CPT copyright 2016 American Medical Association. All rights reserved. The codes documented in this report are preliminary and upon coder review may  be revised to meet current compliance requirements. Iva Booparl E Monica Ion, MD 08/06/2016 1:08:45 PM This report has been signed electronically. Number of Addenda: 0

## 2016-08-06 NOTE — Anesthesia Procedure Notes (Signed)
Procedure Name: MAC Date/Time: 08/06/2016 12:37 PM Performed by: Dione Booze Pre-anesthesia Checklist: Patient identified, Emergency Drugs available, Suction available and Patient being monitored Oxygen Delivery Method: Simple face mask Placement Confirmation: positive ETCO2

## 2016-08-07 ENCOUNTER — Telehealth: Payer: Self-pay | Admitting: Infectious Diseases

## 2016-08-07 NOTE — Telephone Encounter (Signed)
Patient reports she is doing well today.  Yesterday she had watery diarrhea from 3 pm -10 pm last night.  She was concerned that she expelled the transplant because the diarrhea was very watery with only a few particles.  Today she is doing well no diarrhea no cramping. " I don't feel feel bad". She spoke with Dr. Drue SecondSnider this am as well.

## 2016-08-07 NOTE — Telephone Encounter (Signed)
Sheri,  Please get a symptom update re # stools and quality ? Watery, etc  Send back to me and Dr. Drue SecondSnider

## 2016-08-07 NOTE — Telephone Encounter (Signed)
pt called post stool txp with continued diarrhea.  She had taken the lomotil she was rx, wanted to know if she should take more.  I advised her against this.  I let her know that I would pass this on to her providers this AM.

## 2016-08-08 ENCOUNTER — Encounter (HOSPITAL_COMMUNITY): Payer: Self-pay | Admitting: Internal Medicine

## 2016-08-31 NOTE — Addendum Note (Signed)
Addendum  created 08/31/16 0929 by Kalid Ghan, MD   Sign clinical note    

## 2016-10-15 ENCOUNTER — Other Ambulatory Visit: Payer: Self-pay | Admitting: Obstetrics & Gynecology

## 2016-10-15 DIAGNOSIS — Z1231 Encounter for screening mammogram for malignant neoplasm of breast: Secondary | ICD-10-CM

## 2016-10-28 ENCOUNTER — Ambulatory Visit: Payer: Medicare Other | Admitting: Internal Medicine

## 2016-10-29 ENCOUNTER — Encounter: Payer: Self-pay | Admitting: Internal Medicine

## 2016-10-29 ENCOUNTER — Ambulatory Visit (INDEPENDENT_AMBULATORY_CARE_PROVIDER_SITE_OTHER): Payer: Medicare Other | Admitting: Internal Medicine

## 2016-10-29 VITALS — BP 134/81 | HR 65 | Temp 97.9°F | Ht 63.0 in | Wt 173.0 lb

## 2016-10-29 DIAGNOSIS — Z8619 Personal history of other infectious and parasitic diseases: Secondary | ICD-10-CM | POA: Diagnosis present

## 2016-10-29 NOTE — Progress Notes (Signed)
RFV: follow up for FMT  Patient ID: Monica Lamb, female   DOB: 01-14-1941, 76 y.o.   MRN: [id number]  HPI Monica Lamb is a 76yo F who had recurrent cdifficile and underwent FMT on 5/8. Post procedure she had bout of watery diarrhea, but since then has slowly returned back to normal BM pattern. No abtx exposure since her FMT. Eating well. Only have 2 formed BM per day which is her baseline. She is still slightly nervous to go on an extended plane flight or trip on general. Overall, doing well.  Outpatient Encounter Prescriptions as of 10/29/2016  Medication Sig  . acetaminophen (TYLENOL) 500 MG tablet Take 500 mg by mouth every 6 (six) hours as needed for mild pain or headache.  . cholecalciferol (VITAMIN D) 1000 units tablet Take 1,000 Units by mouth daily.  . metroNIDAZOLE (METROCREAM) 0.75 % cream Apply 1 application topically daily. For rosacea  . Phenylephrine-Acetaminophen (TYLENOL SINUS+HEADACHE PO) Take 1 tablet by mouth daily as needed (SINUS HEADACHE).  Vladimir Faster Glycol-Propyl Glycol (SYSTANE OP) Apply 1 drop to eye daily as needed (DRY EYES).   No facility-administered encounter medications on file as of 10/29/2016.      Patient Active Problem List   Diagnosis Date Noted  . C. difficile colitis   . Solitary pulmonary nodule 02/06/2016  . Rosacea 11/27/2015  . Osteopenia 11/27/2015  . BMI 34.0-34.9,adult 11/27/2015  . 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   Review of Systems Review of Systems  Constitutional: Negative for fever, chills, diaphoresis, activity change, appetite change, fatigue and unexpected weight change.  HENT: Negative for congestion, sore throat, rhinorrhea, sneezing, trouble swallowing and sinus pressure.  Eyes: Negative for photophobia and visual disturbance.  Respiratory: Negative for cough, chest tightness,  shortness of breath, wheezing and stridor.  Cardiovascular: Negative for chest pain, palpitations and leg swelling.  Gastrointestinal: Negative for nausea, vomiting, abdominal pain, diarrhea, constipation, blood in stool, abdominal distention and anal bleeding.  Genitourinary: Negative for dysuria, hematuria, flank pain and difficulty urinating.  Musculoskeletal: Negative for myalgias, back pain, joint swelling, arthralgias and gait problem.  Skin: Negative for color change, pallor, rash and wound.  Neurological: Negative for dizziness, tremors, weakness and light-headedness.  Hematological: Negative for adenopathy. Does not bruise/bleed easily.  Psychiatric/Behavioral: Negative for behavioral problems, confusion, sleep disturbance, dysphoric mood, decreased concentration and agitation.    Physical Exam   BP 134/81   Pulse 65   Temp 97.9 F (36.6 C) (Oral)   Ht _0  (1.6 m)   Wt 173 lb (78.5 kg)   BMI 30.65 kg/m    no exam  CBC Lab Results  Component Value Date   WBC 8.0 07/25/2016   RBC 4.88 07/25/2016   HGB 13.2 07/25/2016   HCT 41.3 07/25/2016   PLT 345 07/25/2016   MCV 84.6 07/25/2016   MCH 27.0 07/25/2016   MCHC 32.0 07/25/2016   RDW 14.4 07/25/2016   LYMPHSABS 2,160 07/25/2016   MONOABS 560 07/25/2016   EOSABS 80 07/25/2016    BMET Lab Results  Component Value Date   NA 141 07/25/2016   K 4.0 07/25/2016   CL 105 07/25/2016   CO2 27 07/25/2016   GLUCOSE 79 07/25/2016   BUN 15 07/25/2016   CREATININE 0.67 07/25/2016   CALCIUM 9.2 07/25/2016   GFRNONAA 87 07/25/2016   GFRAA >89 07/25/2016  Assessment and Plan Recurrent clostridium difficile = cured at 60 day post FMT. Will give stool kit if needed if she has future bouts of diarrhea. For now, no need for further treatment.  rtc prn

## 2016-11-07 ENCOUNTER — Ambulatory Visit
Admission: RE | Admit: 2016-11-07 | Discharge: 2016-11-07 | Disposition: A | Discharge status: Home / Self Care | Payer: Medicare Other | Source: Ambulatory Visit | Attending: Obstetrics & Gynecology | Admitting: Obstetrics & Gynecology

## 2016-11-07 DIAGNOSIS — Z1231 Encounter for screening mammogram for malignant neoplasm of breast: Secondary | ICD-10-CM

## 2016-12-19 ENCOUNTER — Encounter: Payer: Self-pay | Admitting: Family Medicine

## 2017-02-06 ENCOUNTER — Ambulatory Visit (INDEPENDENT_AMBULATORY_CARE_PROVIDER_SITE_OTHER): Payer: Medicare Other

## 2017-02-06 VITALS — BP 136/80 | HR 81 | Ht 63.0 in | Wt 180.0 lb

## 2017-02-06 DIAGNOSIS — E2839 Other primary ovarian failure: Secondary | ICD-10-CM | POA: Diagnosis not present

## 2017-02-06 DIAGNOSIS — Z23 Encounter for immunization: Secondary | ICD-10-CM

## 2017-02-06 DIAGNOSIS — Z0000 Encounter for general adult medical examination without abnormal findings: Secondary | ICD-10-CM

## 2017-02-06 NOTE — Progress Notes (Signed)
Monica Olivos R., DO  

## 2017-02-06 NOTE — Progress Notes (Signed)
Subjective:   Kurtis BushmanSharon O Maffei is a 76 y.o. female who presents for Medicare Annual (Subsequent) preventive examination.  The Patient was informed that the wellness visit is to identify future health risk and educate and initiate measures that can reduce risk for increased disease through the lifespan.    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 TETANUS/TDAP  11/05/1961  . INFLUENZA VACCINE  10/30/2016  . 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; fecal transplantation Discussed tetanus; most likely last dose in 93 when they went to [place]  Will take at the pharmacy  Will defer Psv 23 until later this year and will get a nurse visit   VS reviewed;   Diet  Breakfast; 1/2 of banana with BB Has a carb- toast; mini bagels  Had protein mid morning ; egg; cheese; peanut butter  Or leftovers Do not eat lunch due to mid morning protein 2 pm may eat something light; protein; fruit Regular dinner; full dinner meat; salad, vegetables C-diff helped her to cut back on food and make better choices   States transplant  started working immediately   BMI 31.9   Exercise Exercise around the home and walking Is signed up through the fitness center and wants to go back;   Stressors: not as much now; feeling much better     Cardiac Risk Factors Addressed Hyperlipidemia - ratio 3; hdl  49 and trig 76 Pre-diabetes - A1c 5.7 and BS 79  Reviewed Pre-diabetes    Advanced Directives completed   Patient Care Team: Terressa KoyanagiKim, Hannah R, DO as PCP - General (Family Medicine) Judyann MunsonSnider, Cynthia, MD as Consulting Physician (Infectious Diseases) Shea EvansMody, Vaishali, MD as Consulting Physician (Obstetrics and Gynecology)       Objective:     Vitals: BP 136/80   Pulse 81   Ht 5\' 3"  (1.6 m)   Wt 180 lb (81.6 kg)   SpO2 90%   BMI 31.89 kg/m   Body mass index is 31.89 kg/m.   Tobacco Social History   Tobacco Use  Smoking Status Never Smoker  Smokeless Tobacco Never Used     Counseling given: Yes   Past Medical History:  Diagnosis Date  . Allergy   . C. difficile diarrhea   . Cataract    removed x 2   . Glaucoma   . Osteopenia    took fosomax remotely for a few years- past hx-  . Rosacea    -had surgery with Dr. Roda Shuttersaswell remortely;  sees Dr. Dagoberto LigasStoneburner   Past Surgical History:  Procedure Laterality Date  . CATARACT EXTRACTION, BILATERAL    . LYMPH NODE BIOPSY     benign per patient  . lymphnode biopsy    . TUBAL LIGATION     Family History  Problem Relation Age of Onset  . Arthritis Mother   . Hypertension Mother   . Macular degeneration Mother   . Cancer Father   . Lung cancer Father   . Arthritis Sister   . Hypertension Sister   . Cancer Maternal Aunt   . Lung cancer Sister   . Colon cancer Neg Hx   . Colon polyps Neg Hx   . Esophageal cancer Neg Hx   . Stomach cancer Neg Hx   . Rectal cancer Neg Hx    Social History   Substance and Sexual Activity  Sexual Activity Not Currently    Outpatient Encounter Medications as of 02/06/2017  Medication Sig  . acetaminophen (TYLENOL)  500 MG tablet Take 500 mg by mouth every 6 (six) hours as needed for mild pain or headache.  . cholecalciferol (VITAMIN D) 1000 units tablet Take 1,000 Units by mouth daily.  . metroNIDAZOLE (METROCREAM) 0.75 % cream Apply 1 application topically daily. For rosacea  . Phenylephrine-Acetaminophen (TYLENOL SINUS+HEADACHE PO) Take 1 tablet by mouth daily as needed (SINUS HEADACHE).  Bertram Gala. Polyethyl Glycol-Propyl Glycol (SYSTANE OP) Apply 1 drop to eye daily as needed (DRY EYES).   No facility-administered encounter medications on file as of 02/06/2017.     Activities of Daily Living In your present state of health, do you have any difficulty performing the following activities: 02/06/2017 03/18/2016  Hearing? N N  Vision? N N  Difficulty concentrating or making decisions? N N  Comment no issues  -  Walking or climbing stairs? N N  Comment has a 2 story home  -  Dressing or bathing? N N  Doing errands, shopping? N N  Preparing Food and eating ? N -  Using the Toilet? N -  In the past six months, have you accidently leaked urine? N -  Comment frequency -  Do you have problems with loss of bowel control? N -  Comment much better since transplant  -  Some recent data might be hidden    Patient Care Team: Terressa KoyanagiKim, Hannah R, DO as PCP - General (Family Medicine) Judyann MunsonSnider, Cynthia, MD as Consulting Physician (Infectious Diseases) Shea EvansMody, Vaishali, MD as Consulting Physician (Obstetrics and Gynecology)    Assessment:     Exercise Activities and Dietary recommendations [facility]    Goals    None     Fall Risk Fall Risk  02/06/2017 10/29/2016 06/05/2016 03/18/2016 02/05/2016  Falls in the past year? No No No No No   Depression Screen PHQ 2/9 Scores 02/06/2017 10/29/2016 06/05/2016 03/18/2016  PHQ - 2 Score 0 0 0 0     Cognitive Function MMSE - Mini Mental State Exam 02/02/2016  Not completed: (No Data)        Immunization History  Administered Date(s) Administered  . Influenza,  High Dose Seasonal PF 02/06/2016, 02/06/2017  . Pneumococcal Conjugate-13 11/27/2015   Screening Tests Health Maintenance  Topic Date Due  . TETANUS/TDAP  11/05/1961  . INFLUENZA VACCINE  10/30/2016  . PNA vac Low Risk Adult (2 of 2 - PPSV23) 11/26/2016  . MAMMOGRAM  11/08/2018  . COLONOSCOPY  08/07/2026  . DEXA SCAN  Completed      Plan:  PCP Notes   Health Maintenance Took her high dose flu vaccine today Decided to wait and take her PSV 23 at a nurse visit later this year  Will take the tetanus at the pharmacy    Abnormal Screens  None   Referrals  To the [facility] center for a repeat dexa; prefers Dr. Selena Batten follow  Patient concerns;   Nurse Concerns; Antibiotic recommendations from the after FMT Notes added to allergy list for low risk antibiotics moderate risk and high risk for causing cdiff  Next PCP apt TBS       I have personally reviewed and noted the following in the patient's chart:   . Medical and social history . Use of alcohol, tobacco or illicit drugs  . Current medications and supplements . Functional ability and status . Nutritional status . Physical activity . Advanced directives . List of other physicians . Hospitalizations, surgeries, and ER visits in previous 12 months . Vitals . Screenings to include cognitive, depression, and falls . Referrals and appointments  In addition, I have reviewed and discussed with patient certain preventive protocols, quality metrics, and best practice recommendations. A written personalized care plan for preventive services as well as general preventive health recommendations were provided to patient.     Montine Circle, RN  02/06/2017

## 2017-02-06 NOTE — Patient Instructions (Addendum)
Ms. Suk , Thank you for taking time to come for your Medicare Wellness Visit. I appreciate your ongoing commitment to your health goals. Please review the following plan we discussed and let me know if I can assist you in the future.   The Centers for Disease Control are now recommending 2 pneumonia vaccinations after 16. The first is the Prevnar 13. This helps to boost your immunity to community acquired pneumonia as well as some protection from bacterial pneumonia  The 2nd is the pneumovax 23, which offers more broad protection!  Please consider taking these as this is your best protection against pneumonia. Will take psv 23 the last pneumonia vaccine you will need; sometimes within the next 4 to 6 months  You can make a nurse visit   Shingrix is a vaccine for the prevention of Shingles in Adults 50 and older.  If you are on Medicare, you can request a prescription from your doctor to be filled at a pharmacy.  Please check with your benefits regarding applicable copays or out of pocket expenses.  The Shingrix is given in 2 vaccines approx 8 weeks apart. You must receive the 2nd dose prior to 6 months from receipt of the first.  Will consider discuss with Dr. Maudie Mercury -Neosporin reaction   A Tetanus is recommended every 10 years. Medicare covers a tetanus if you have a cut or wound; otherwise, there may be a charge. If you had not had a tetanus with pertusses, known as the Tdap, you can take this anytime.   You can have a hearing screen anywhere anytime, as medicare covers this.  Will review the antibiotic list with Dr. Maudie Mercury   These are the goals we discussed: [facility]  Goals    None      This is a list of the screening recommended for you and due dates:  Health Maintenance  Topic Date Due  . Tetanus Vaccine  11/05/1959  . Flu Shot  10/30/2016  . Pneumonia vaccines (2 of 2 - PPSV23) 11/26/2016  . Mammogram  11/08/2018  . Colon Cancer Screening  08/07/2026   . DEXA scan (bone density measurement)  Completed      Fall Prevention in the Home Falls can cause injuries. They can happen to people of all ages. There are many things you can do to make your home safe and to help prevent falls. What can I do on the outside of my home?  Regularly fix the edges of walkways and driveways and fix any cracks.  Remove anything that might make you trip as you walk through a door, such as a raised step or threshold.  Trim any bushes or trees on the path to your home.  Use bright outdoor lighting.  Clear any walking paths of anything that might make someone trip, such as rocks or tools.  Regularly check to see if handrails are loose or broken. Make sure that both sides of any steps have handrails.  Any raised decks and porches should have guardrails on the edges.  Have any leaves, snow, or ice cleared regularly.  Use sand or salt on walking paths during winter.  Clean up any spills in your garage right away. This includes oil or grease spills. What can I do in the bathroom?  Use night lights.  Install grab bars by the toilet and in the tub and shower. Do not use towel bars as grab bars.  Use non-skid mats or decals in  the tub or shower.  If you need to sit down in the shower, use a plastic, non-slip stool.  Keep the floor dry. Clean up any water that spills on the floor as soon as it happens.  Remove soap buildup in the tub or shower regularly.  Attach bath mats securely with double-sided non-slip rug tape.  Do not have throw rugs and other things on the floor that can make you trip. What can I do in the bedroom?  Use night lights.  Make sure that you have a light by your bed that is easy to reach.  Do not use any sheets or blankets that are too big for your bed. They should not hang down onto the floor.  Have a firm chair that has side arms. You can use this for support while you get dressed.  Do not have throw rugs and other  things on the floor that can make you trip. What can I do in the kitchen?  Clean up any spills right away.  Avoid walking on wet floors.  Keep items that you use a lot in easy-to-reach places.  If you need to reach something above you, use a strong step stool that has a grab bar.  Keep electrical cords out of the way.  Do not use floor polish or wax that makes floors slippery. If you must use wax, use non-skid floor wax.  Do not have throw rugs and other things on the floor that can make you trip. What can I do with my stairs?  Do not leave any items on the stairs.  Make sure that there are handrails on both sides of the stairs and use them. Fix handrails that are broken or loose. Make sure that handrails are as long as the stairways.  Check any carpeting to make sure that it is firmly attached to the stairs. Fix any carpet that is loose or worn.  Avoid having throw rugs at the top or bottom of the stairs. If you do have throw rugs, attach them to the floor with carpet tape.  Make sure that you have a light switch at the top of the stairs and the bottom of the stairs. If you do not have them, ask someone to add them for you. What else can I do to help prevent falls?  Wear shoes that: ? Do not have high heels. ? Have rubber bottoms. ? Are comfortable and fit you well. ? Are closed at the toe. Do not wear sandals.  If you use a stepladder: ? Make sure that it is fully opened. Do not climb a closed stepladder. ? Make sure that both sides of the stepladder are locked into place. ? Ask someone to hold it for you, if possible.  Clearly mark and make sure that you can see: ? Any grab bars or handrails. ? First and last steps. ? Where the edge of each step is.  Use tools that help you move around (mobility aids) if they are needed. These include: ? Canes. ? Walkers. ? Scooters. ? Crutches.  Turn on the lights when you go into a dark area. Replace any light bulbs as soon as  they burn out.  Set up your furniture so you have a clear path. Avoid moving your furniture around.  If any of your floors are uneven, fix them.  If there are any pets around you, be aware of where they are.  Review your medicines with your doctor. Some medicines can  make you feel dizzy. This can increase your chance of falling. Ask your doctor what other things that you can do to help prevent falls. This information is not intended to replace advice given to you by your health care provider. Make sure you discuss any questions you have with your health care provider. Document Released: 01/12/2009 Document Revised: 08/24/2015 Document Reviewed: 04/22/2014 Elsevier Interactive Patient Education  2018 [place] Maintenance, Female Adopting a healthy lifestyle and getting preventive care can go a long way to promote health and wellness. Talk with your health care provider about what schedule of regular examinations is right for you. This is a good chance for you to check in with your provider about disease prevention and staying healthy. In between checkups, there are plenty of things you can do on your own. Experts have done a lot of research about which lifestyle changes and preventive measures are most likely to keep you healthy. Ask your health care provider for more information. Weight and diet Eat a healthy diet  Be sure to include plenty of vegetables, fruits, low-fat dairy products, and lean protein.  Do not eat a lot of foods high in solid fats, added sugars, or salt.  Get regular exercise. This is one of the most important things you can do for your health. ? Most adults should exercise for at least 150 minutes each week. The exercise should increase your heart rate and make you sweat (moderate-intensity exercise). ? Most adults should also do strengthening exercises at least twice a week. This is in addition to the moderate-intensity exercise.  Maintain a healthy  weight  Body mass index (BMI) is a measurement that can be used to identify possible weight problems. It estimates body fat based on height and weight. Your health care provider can help determine your BMI and help you achieve or maintain a healthy weight.  For females 34 years of age and older: ? A BMI below 18.5 is considered underweight. ? A BMI of 18.5 to 24.9 is normal. ? A BMI of 25 to 29.9 is considered overweight. ? A BMI of 30 and above is considered obese.  Watch levels of cholesterol and blood lipids  You should start having your blood tested for lipids and cholesterol at 74 years of age, then have this test every 5 years.  You may need to have your cholesterol levels checked more often if: ? Your lipid or cholesterol levels are high. ? You are older than 74 years of age. ? You are at high risk for heart disease.  Cancer screening Lung Cancer  Lung cancer screening is recommended for adults 70-89 years old who are at high risk for lung cancer because of a history of smoking.  A yearly low-dose CT scan of the lungs is recommended for people who: ? Currently smoke. ? Have quit within the past 15 years. ? Have at least a 30-pack-year history of smoking. A pack year is smoking an average of one pack of cigarettes a day for 1 year.  Yearly screening should continue until it has been 15 years since you quit.  Yearly screening should stop if you develop a health problem that would prevent you from having lung cancer treatment.  Breast Cancer  Practice breast self-awareness. This means understanding how your breasts normally appear and feel.  It also means doing regular breast self-exams. Let your health care provider know about any changes, no matter how small.  If you are in your 73s  or 4s, you should have a clinical breast exam (CBE) by a health care provider every 1-3 years as part of a regular health exam.  If you are 73 or older, have a CBE every year. Also consider  having a breast X-ray (mammogram) every year.  If you have a family history of breast cancer, talk to your health care provider about genetic screening.  If you are at high risk for breast cancer, talk to your health care provider about having an MRI and a mammogram every year.  Breast cancer gene (BRCA) assessment is recommended for women who have family members with BRCA-related cancers. BRCA-related cancers include: ? Breast. ? Ovarian. ? Tubal. ? Peritoneal cancers.  Results of the assessment will determine the need for genetic counseling and BRCA1 and BRCA2 testing.  Cervical Cancer Your health care provider may recommend that you be screened regularly for cancer of the pelvic organs (ovaries, uterus, and vagina). This screening involves a pelvic examination, including checking for microscopic changes to the surface of your cervix (Pap test). You may be encouraged to have this screening done every 3 years, beginning at age 34.  For women ages 87-65, health care providers may recommend pelvic exams and Pap testing every 3 years, or they may recommend the Pap and pelvic exam, combined with testing for human papilloma virus (HPV), every 5 years. Some types of HPV increase your risk of cervical cancer. Testing for HPV may also be done on women of any age with unclear Pap test results.  Other health care providers may not recommend any screening for nonpregnant women who are considered low risk for pelvic cancer and who do not have symptoms. Ask your health care provider if a screening pelvic exam is right for you.  If you have had past treatment for cervical cancer or a condition that could lead to cancer, you need Pap tests and screening for cancer for at least 20 years after your treatment. If Pap tests have been discontinued, your risk factors (such as having a new sexual partner) need to be reassessed to determine if screening should resume. Some women have medical problems that increase  the chance of getting cervical cancer. In these cases, your health care provider may recommend more frequent screening and Pap tests.  Colorectal Cancer  This type of cancer can be detected and often prevented.  Routine colorectal cancer screening usually begins at 74 years of age and continues through 74 years of age.  Your health care provider may recommend screening at an earlier age if you have risk factors for colon cancer.  Your health care provider may also recommend using home test kits to check for hidden blood in the stool.  A small camera at the end of a tube can be used to examine your colon directly (sigmoidoscopy or colonoscopy). This is done to check for the earliest forms of colorectal cancer.  Routine screening usually begins at age 40.  Direct examination of the colon should be repeated every 5-10 years through 74 years of age. However, you may need to be screened more often if early forms of precancerous polyps or small growths are found.  Skin Cancer  Check your skin from head to toe regularly.  Tell your health care provider about any new moles or changes in moles, especially if there is a change in a mole's shape or color.  Also tell your health care provider if you have a mole that is larger than the size of a  pencil eraser.  Always use sunscreen. Apply sunscreen liberally and repeatedly throughout the day.  Protect yourself by wearing long sleeves, pants, a wide-brimmed hat, and sunglasses whenever you are outside.  Heart disease, diabetes, and high blood pressure  High blood pressure causes heart disease and increases the risk of stroke. High blood pressure is more likely to develop in: ? People who have blood pressure in the high end of the normal range (130-139/85-89 mm Hg). ? People who are overweight or obese. ? People who are African American.  If you are 20-55 years of age, have your blood pressure checked every 3-5 years. If you are 22 years of age  or older, have your blood pressure checked every year. You should have your blood pressure measured twice-once when you are at a hospital or clinic, and once when you are not at a hospital or clinic. Record the average of the two measurements. To check your blood pressure when you are not at a hospital or clinic, you can use: ? An automated blood pressure machine at a pharmacy. ? A home blood pressure monitor.  If you are between 78 years and 57 years old, ask your health care provider if you should take aspirin to prevent strokes.  Have regular diabetes screenings. This involves taking a blood sample to check your fasting blood sugar level. ? If you are at a normal weight and have a low risk for diabetes, have this test once every three years after 74 years of age. ? If you are overweight and have a high risk for diabetes, consider being tested at a younger age or more often. Preventing infection Hepatitis B  If you have a higher risk for hepatitis B, you should be screened for this virus. You are considered at high risk for hepatitis B if: ? You were born in a country where hepatitis B is common. Ask your health care provider which countries are considered high risk. ? Your parents were born in a high-risk country, and you have not been immunized against hepatitis B (hepatitis B vaccine). ? You have HIV or AIDS. ? You use needles to inject street drugs. ? You live with someone who has hepatitis B. ? You have had sex with someone who has hepatitis B. ? You get hemodialysis treatment. ? You take certain medicines for conditions, including cancer, organ transplantation, and autoimmune conditions.  Hepatitis C  Blood testing is recommended for: ? Everyone born from [birth year] through 1963. ? Anyone with known risk factors for hepatitis C.  Sexually transmitted infections (STIs)  You should be screened for sexually transmitted infections (STIs) including gonorrhea and chlamydia if: ? You are  sexually active and are younger than 74 years of age. ? You are older than 74 years of age and your health care provider tells you that you are at risk for this type of infection. ? Your sexual activity has changed since you were last screened and you are at an increased risk for chlamydia or gonorrhea. Ask your health care provider if you are at risk.  If you do not have HIV, but are at risk, it may be recommended that you take a prescription medicine daily to prevent HIV infection. This is called pre-exposure prophylaxis (PrEP). You are considered at risk if: ? You are sexually active and do not regularly use condoms or know the HIV status of your partner(s). ? You take drugs by injection. ? You are sexually active with a partner who has HIV.  Talk with your health care provider about whether you are at high risk of being infected with HIV. If you choose to begin PrEP, you should first be tested for HIV. You should then be tested every 3 months for as long as you are taking PrEP. Pregnancy  If you are premenopausal and you may become pregnant, ask your health care provider about preconception counseling.  If you may become pregnant, take 400 to 800 micrograms (mcg) of folic acid every day.  If you want to prevent pregnancy, talk to your health care provider about birth control (contraception). Osteoporosis and menopause  Osteoporosis is a disease in which the bones lose minerals and strength with aging. This can result in serious bone fractures. Your risk for osteoporosis can be identified using a bone density scan.  If you are 58 years of age or older, or if you are at risk for osteoporosis and fractures, ask your health care provider if you should be screened.  Ask your health care provider whether you should take a calcium or vitamin D supplement to lower your risk for osteoporosis.  Menopause may have certain physical symptoms and risks.  Hormone replacement therapy may reduce some  of these symptoms and risks. Talk to your health care provider about whether hormone replacement therapy is right for you. Follow these instructions at home:  Schedule regular health, dental, and eye exams.  Stay current with your immunizations.  Do not use any tobacco products including cigarettes, chewing tobacco, or electronic cigarettes.  If you are pregnant, do not drink alcohol.  If you are breastfeeding, limit how much and how often you drink alcohol.  Limit alcohol intake to no more than 1 drink per day for nonpregnant women. One drink equals 12 ounces of beer, 5 ounces of wine, or 1 ounces of hard liquor.  Do not use street drugs.  Do not share needles.  Ask your health care provider for help if you need support or information about quitting drugs.  Tell your health care provider if you often feel depressed.  Tell your health care provider if you have ever been abused or do not feel safe at home. This information is not intended to replace advice given to you by your health care provider. Make sure you discuss any questions you have with your health care provider. Document Released: 10/01/2010 Document Revised: 08/24/2015 Document Reviewed: 12/20/2014 Elsevier Interactive Patient Education  [employer].

## 2017-04-10 ENCOUNTER — Encounter: Payer: Self-pay | Admitting: Family Medicine

## 2017-04-24 ENCOUNTER — Ambulatory Visit
Admission: RE | Admit: 2017-04-24 | Discharge: 2017-04-24 | Disposition: A | Discharge status: Home / Self Care | Payer: Medicare Other | Source: Ambulatory Visit | Attending: Family Medicine | Admitting: Family Medicine

## 2017-04-24 DIAGNOSIS — E2839 Other primary ovarian failure: Secondary | ICD-10-CM

## 2017-04-29 ENCOUNTER — Ambulatory Visit (INDEPENDENT_AMBULATORY_CARE_PROVIDER_SITE_OTHER): Payer: Medicare Other | Admitting: Family Medicine

## 2017-04-29 ENCOUNTER — Encounter: Payer: Self-pay | Admitting: Family Medicine

## 2017-04-29 VITALS — BP 112/60 | HR 76 | Temp 97.5°F | Ht 62.0 in | Wt 186.7 lb

## 2017-04-29 DIAGNOSIS — Z23 Encounter for immunization: Secondary | ICD-10-CM | POA: Diagnosis not present

## 2017-04-29 DIAGNOSIS — E559 Vitamin D deficiency, unspecified: Secondary | ICD-10-CM | POA: Diagnosis not present

## 2017-04-29 DIAGNOSIS — Z7185 Encounter for immunization safety counseling: Secondary | ICD-10-CM

## 2017-04-29 DIAGNOSIS — M8580 Other specified disorders of bone density and structure, unspecified site: Secondary | ICD-10-CM

## 2017-04-29 DIAGNOSIS — Z7189 Other specified counseling: Secondary | ICD-10-CM

## 2017-04-29 LAB — VITAMIN D 25 HYDROXY (VIT D DEFICIENCY, FRACTURES): VITD: 21.21 ng/mL — ABNORMAL LOW (ref 30.00–100.00)

## 2017-04-29 NOTE — Progress Notes (Signed)
HPI:  Here for several issues.  Osteopenia: -see scanned bone density report, reviewed with patient -History of taking Fosamax for 2 years in 2012, reports her skan improved so much and she did not like taking this medication that stopped the medicine -it upset her stomach and she was worried about osteonecrosis of the jaw - prefers not to take medicines  -has been taking vitamin D 1000 international units daily and reports a history of vitamin D deficiency -Denies history of any postmenopausal fractures, has a remote history of a clavicle fracture as a child -Not a smoker  To get her pneumonia vaccine today.  Wonders if this is okay given she is going to have dental work soon or issue.   ROS: See pertinent positives and negatives per HPI.  Past Medical History:  Diagnosis Date  . Allergy   . C. difficile diarrhea   . Cataract    removed x 2   . Glaucoma   . Osteopenia    took fosomax remotely for a few years- past hx-  . Rosacea    -had surgery with Dr. Roda Shutters remortely; sees Dr. Dagoberto Ligas    Past Surgical History:  Procedure Laterality Date  . CATARACT EXTRACTION, BILATERAL    . COLONOSCOPY N/A 08/06/2016   Procedure: COLONOSCOPY;  Surgeon: Iva Boop, MD;  Location: [facility];  Service: Endoscopy;  Laterality: N/A;  . 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 LYMPH NODE BIOPSY     benign per patient  . lymphnode biopsy    . TUBAL LIGATION      Family History  Problem Relation Age of Onset  . Arthritis Mother   . Hypertension Mother   . Macular degeneration Mother   . Cancer Father   . Lung cancer Father   . Arthritis Sister   . Hypertension Sister   . Cancer Maternal Aunt   . Lung cancer Sister   . Colon cancer Neg Hx   . Colon polyps Neg Hx   . Esophageal cancer Neg Hx   . Stomach cancer Neg Hx   . Rectal cancer Neg Hx      Social History   Socioeconomic History  . Marital status: Widowed    Spouse name: None  . Number of children: None  . Years of education: None  . Highest education level: None  Social Needs  . Financial resource strain: None  . Food insecurity - worry: None  . Food insecurity - inability: None  . Transportation needs - medical: None  . Transportation needs - non-medical: None  Occupational History  . None  Tobacco Use  . Smoking status: Never Smoker  . Smokeless tobacco: Never Used  Substance and Sexual Activity  . Alcohol use: No  . Drug use: No  . Sexual activity: Not Currently  Other Topics Concern  . 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  acetaminophen (TYLENOL) 500 MG tablet, Take 500 mg by mouth every 6 (six) hours as needed for mild pain  or headache., Disp: , Rfl:  .  cholecalciferol (VITAMIN D) 1000 units tablet, Take 1,000 Units by mouth daily., Disp: , Rfl:  .  metroNIDAZOLE (METROCREAM) 0.75 % cream, Apply 1 application topically daily. For rosacea, Disp: , Rfl:  .  Phenylephrine-Acetaminophen (TYLENOL SINUS+HEADACHE PO), Take 1 tablet by mouth daily as needed (SINUS HEADACHE)., Disp: , Rfl:  .  Polyethyl Glycol-Propyl Glycol (SYSTANE OP), Apply 1 drop to eye daily as needed (DRY EYES)., Disp: , Rfl:   EXAM:  Vitals:   04/29/17 1300  BP: 112/60  Pulse: 76  Temp: (!) 97.5 F (36.4 C)    Body mass index is 34.15 kg/m.  GENERAL: vitals reviewed and listed above, alert, oriented, appears well hydrated and in no acute distress  HEENT: atraumatic, conjunttiva clear, no obvious abnormalities on inspection of external nose and ears  NECK:  no obvious masses on inspection  LUNGS: clear to auscultation bilaterally, no wheezes, rales or rhonchi, good air movement  CV: HRRR, no peripheral edema  MS: moves all extremities without noticeable abnormality  PSYCH: pleasant and cooperative, no obvious depression or anxiety  ASSESSMENT AND PLAN:  Discussed the following assessment and plan:  Osteopenia, unspecified location - Plan: VITAMIN D 25 Hydroxy (Vit-D Deficiency, Fractures)  Vitamin D deficiency - Plan: VITAMIN D 25 Hydroxy (Vit-D Deficiency, Fractures)  Vaccine counseling  - her bone density results and treatment options -She prefers to avoid medicines and wants to try vitamin D and weightbearing exercise along with a healthy diet and adequate dietary calcium -Discussed risks/benefits of pneumonia vaccine, she opted to get today -Check a vitamin D and adjust dosing as needed, discussed top 3 recommended vitamin D supplements per the most recent consumer labs report -Patient advised to return or notify a doctor immediately if symptoms worsen or persist or new concerns arise.  Patient Instructions  BEFORE YOU LEAVE: -pneumococcal 23 -lab -follow up: November 2019 for AWV with Darl PikesSusan an df/u with Dr. Selena BattenKim  Vit D3 [phone] IU daily  Regular weight bearing exercise  Healthy diet   We recommend the following healthy lifestyle for LIFE: 1) Small portions. But, make sure to get regular (at least 3 per day), healthy meals and small healthy snacks if needed.  2) Eat a healthy clean diet.   TRY TO EAT: -at least 5-7 servings of low sugar, colorful, and nutrient rich vegetables per day (not corn, potatoes or bananas.) -berries are the best choice if you wish to eat fruit (only eat small amounts if trying to reduce weight)  -lean meets (fish, white meat of chicken or [place]) -vegan proteins for some meals - beans or tofu, whole grains, nuts and seeds -Replace bad fats with good fats - good fats include: fish, nuts and  seeds, canola oil, olive oil -small amounts of low fat or non fat dairy -small amounts of100 % whole grains - check the lables -drink plenty of water  AVOID: -SUGAR, sweets, anything with added sugar, corn syrup or sweeteners - must read labels as even foods advertised as "healthy" often are loaded with sugar -if you must have a sweetener, small amounts of stevia may be best -sweetened beverages and artificially sweetened beverages -simple starches (rice, bread, potatoes, pasta, chips, etc - small amounts of 100% whole grains are ok) -red meat, pork, butter -fried foods, fast food, processed food, excessive dairy, eggs and coconut.  3)Get at least 150 minutes of sweaty aerobic exercise per week.  4)Reduce stress - consider counseling, meditation and relaxation to  balance other aspects of your life.    [name], DO

## 2017-04-29 NOTE — Patient Instructions (Addendum)
BEFORE YOU LEAVE: -pneumococcal 23 -lab -follow up: November 2019 for AWV with Darl PikesSusan an df/u with Dr. Selena BattenKim  Vit D3 [phone] IU daily  Regular weight bearing exercise  Healthy diet   We recommend the following healthy lifestyle for LIFE: 1) Small portions. But, make sure to get regular (at least 3 per day), healthy meals and small healthy snacks if needed.  2) Eat a healthy clean diet.   TRY TO EAT: -at least 5-7 servings of low sugar, colorful, and nutrient rich vegetables per day (not corn, potatoes or bananas.) -berries are the best choice if you wish to eat fruit (only eat small amounts if trying to reduce weight)  -lean meets (fish, white meat of chicken or [place]) -vegan proteins for some meals - beans or tofu, whole grains, nuts and seeds -Replace bad fats with good fats - good fats include: fish, nuts and seeds, canola oil, olive oil -small amounts of low fat or non fat dairy -small amounts of100 % whole grains - check the lables -drink plenty of water  AVOID: -SUGAR, sweets, anything with added sugar, corn syrup or sweeteners - must read labels as even foods advertised as "healthy" often are loaded with sugar -if you must have a sweetener, small amounts of stevia may be best -sweetened beverages and artificially sweetened beverages -simple starches (rice, bread, potatoes, pasta, chips, etc - small amounts of 100% whole grains are ok) -red meat, pork, butter -fried foods, fast food, processed food, excessive dairy, eggs and coconut.  3)Get at least 150 minutes of sweaty aerobic exercise per week.  4)Reduce stress - consider counseling, meditation and relaxation to balance other aspects of your life.

## 2017-04-29 NOTE — Addendum Note (Signed)
Addended by: Johnella MoloneyFUNDERBURK, JO A on: 04/29/2017 01:43 PM   Modules accepted: Orders

## 2017-05-01 NOTE — Addendum Note (Signed)
Addended by: Johnella MoloneyFUNDERBURK, JO A on: 05/01/2017 02:19 PM   Modules accepted: Orders

## 2017-05-12 ENCOUNTER — Telehealth: Payer: Self-pay | Admitting: *Deleted

## 2017-05-12 NOTE — Telephone Encounter (Signed)
[name] from Dr Shea EvansBelcher office called to ask Dr Drue SecondSnider about antibiotics following a procedure. Advised will send the doctor a message and have her call Dr Shea EvansBelcher as soon as she can to discuss.

## 2017-05-21 ENCOUNTER — Other Ambulatory Visit: Payer: Self-pay | Admitting: Internal Medicine

## 2017-05-21 NOTE — Progress Notes (Unsigned)
Had tooth extraction today and wondering if she needs antibiotics. Using chlorhexedine

## 2017-08-04 ENCOUNTER — Other Ambulatory Visit (INDEPENDENT_AMBULATORY_CARE_PROVIDER_SITE_OTHER): Payer: Medicare Other

## 2017-08-04 DIAGNOSIS — E559 Vitamin D deficiency, unspecified: Secondary | ICD-10-CM

## 2017-08-04 LAB — VITAMIN D 25 HYDROXY (VIT D DEFICIENCY, FRACTURES): VITD: 29.01 ng/mL — ABNORMAL LOW (ref 30.00–100.00)

## 2017-10-23 ENCOUNTER — Other Ambulatory Visit: Payer: Self-pay | Admitting: Obstetrics & Gynecology

## 2017-10-23 DIAGNOSIS — Z1231 Encounter for screening mammogram for malignant neoplasm of breast: Secondary | ICD-10-CM

## 2017-11-13 ENCOUNTER — Ambulatory Visit
Admission: RE | Admit: 2017-11-13 | Discharge: 2017-11-13 | Disposition: A | Discharge status: Home / Self Care | Payer: Medicare Other | Source: Ambulatory Visit | Attending: Obstetrics & Gynecology | Admitting: Obstetrics & Gynecology

## 2017-11-13 DIAGNOSIS — Z1231 Encounter for screening mammogram for malignant neoplasm of breast: Secondary | ICD-10-CM

## 2018-02-09 NOTE — Progress Notes (Signed)
Subjective:   Monica Lamb is a 77 y.o. female who presents for Medicare Annual (Subsequent) preventive examination.   Reports health as good  Dr. Selena Batten 1:45  Just got back from [place]   Diet Chol/hdl 3 Is good; had hx c diff;  Favor fruits and vegetables Berries and grapes and bananas   Exercise Tries to stay active Has a fitness center membership  Right ankle and injured herself    There are no preventive care reminders to display for this patient. Colonoscopy 07/2016 - in connection with c-diff  Mammogram 10/2017  dexa 04/2017  -2.3   Weight bearing exercise introduced   Signed beneficiary notice for LDCT to follow up on pulmonary nodule      Objective:     Vitals: BP (!) 98/58   Pulse 75   Ht 5\' 2"  (1.575 m)   Wt 189 lb (85.7 kg)   BMI 34.57 kg/m   Body mass index is 34.57 kg/m.  Advanced Directives 02/10/2018 02/06/2017 02/06/2017 08/06/2016 08/01/2016 02/02/2016  Does Patient Have a Medical Advance Directive? Yes Yes Yes Yes Yes Yes  Type of Advance Directive - - - Living will;Healthcare Power of [employer] of [place];Living will -  Copy of Healthcare Power of Attorney in Chart? - - - No - copy requested - No - copy requested    Tobacco Social History   Tobacco Use  Smoking Status Never Smoker  Smokeless Tobacco Never Used  Tobacco Comment   was around 2ndary smoke     Counseling given: Yes Comment: was around 2ndary smoke   Clinical Intake:     Past Medical History:  Diagnosis Date  . Allergy   . C. difficile diarrhea   . Cataract    removed x 2   . Glaucoma   . Osteopenia    took fosomax remotely for a few years- past hx-  . Rosacea    -had surgery with Dr. Roda Shutters remortely; sees Dr. Dagoberto Ligas   Past Surgical History:  Procedure Laterality Date  . CATARACT EXTRACTION, BILATERAL    . COLONOSCOPY N/A 08/06/2016   Procedure: COLONOSCOPY;  Surgeon: Iva Boop, MD;  Location: [facility];  Service: Endoscopy;   Laterality: N/A;  . 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 LYMPH NODE BIOPSY     benign per patient  . lymphnode biopsy    . TUBAL LIGATION     Family History  Problem Relation Age of Onset  . Arthritis Mother   . Hypertension Mother   . Macular degeneration Mother   . Cancer Father   . Lung cancer Father   . Arthritis Sister   . Hypertension Sister   . Cancer Maternal Aunt   . Lung cancer Sister   . Colon cancer Neg Hx   . Colon polyps Neg Hx   . Esophageal cancer Neg Hx   . Stomach cancer Neg Hx   . Rectal cancer Neg Hx    Social History   Socioeconomic History  . Marital status: Widowed    Spouse name: Not on file  . Number of children: Not on file  . Years of education: Not on file  . Highest education level: Not on file  Occupational History  . Not on file  Social Needs  . Financial resource strain: Not on file  . Food insecurity:    Worry: Not  on file    Inability: Not on file  . Transportation needs:    Medical: Not on file    Non-medical: Not on file  Tobacco Use  . Smoking status: Never Smoker  . Smokeless tobacco: Never Used  . Tobacco comment: was around 2ndary smoke  Substance and Sexual Activity  . Alcohol use: No  . Drug use: No  . Sexual activity: Not Currently  Lifestyle  . Physical activity:    Days per week: Not on file    Minutes per session: Not on file  . Stress: Not on file  Relationships  . Social connections:    Talks on phone: Not on file    Gets together: Not on file    Attends religious service: Not on file    Active member of club or organization: Not on file    Attends meetings of clubs or organizations: Not on file    Relationship status: Not on file  Other Topics Concern  . 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 acetaminophen (TYLENOL) 500 MG tablet Take 500 mg by mouth every 6 (six) hours as needed for mild pain or headache.  . cholecalciferol (VITAMIN D) 1000 units tablet Take 1,000 Units by mouth daily.  . metroNIDAZOLE (METROCREAM) 0.75 % cream Apply 1 application topically daily. For rosacea  . Phenylephrine-Acetaminophen (TYLENOL SINUS+HEADACHE PO) Take 1 tablet by mouth daily as needed (SINUS HEADACHE).  Bertram Gala Glycol-Propyl Glycol (SYSTANE OP) Apply 1 drop to eye daily as needed (DRY EYES).   No facility-administered encounter medications on file as of 02/10/2018.     Activities of Daily Living In your present state of health, do you have any difficulty performing the following activities: 02/10/2018  Hearing? N  Vision? N  Difficulty concentrating or making decisions? N  Walking or climbing stairs? N  Dressing or bathing? N  Doing errands, shopping? N  Preparing Food and eating ? N  Using the Toilet? N  In the past six months, have you accidently leaked urine? N  Do you have problems with loss of bowel control? N  Managing your Medications? N  Managing your Finances? N  Housekeeping or managing your Housekeeping? N  Some recent data might be hidden    Patient Care Team: [name], DO as PCP - General (Family Medicine) Judyann Munson, MD as Consulting Physician (Infectious Diseases) Shea Evans, MD as Consulting Physician (Obstetrics and Gynecology)    Assessment:   This is a routine wellness examination for Monica Lamb.  Exercise Activities and Dietary recommendations Current Exercise Habits: Home exercise routine  Goals    . patient     Start to plan trips;  [facility] back [facility] when  symptoms resolve     . patient     [facility] the [facility]     . Patient Stated     Goal will be to go to [place]        Fall Risk Fall Risk  02/10/2018 02/06/2017 10/29/2016 06/05/2016 03/18/2016  Falls in  the past year? 0 No No No No    Depression Screen PHQ 2/9 Scores 02/10/2018 02/06/2017 10/29/2016 06/05/2016  PHQ - 2 Score 0 0 0 0     Cognitive Function MMSE - Mini Mental State Exam 02/10/2018 02/06/2017 02/02/2016  Not completed: (No Data) (No Data) (No Data)        Immunization History  Administered Date(s) Administered  . Influenza, High Dose Seasonal PF 02/06/2016, 02/06/2017  . Pneumococcal Conjugate-13 11/27/2015  . Pneumococcal Polysaccharide-23 04/29/2017      Screening Tests Health Maintenance  Topic Date Due  . TETANUS/TDAP  08/11/2018 (Originally 11/05/1959)  . COLONOSCOPY  08/07/2026  . INFLUENZA VACCINE  Completed  . DEXA SCAN  Completed  . PNA vac Low Risk Adult  Completed         Plan:       PCP Notes   Health Maintenance Colonoscopy 07/2016 - in connection with c-diff  Mammogram 10/2017  dexa 04/2017  -2.3   Weight bearing exercise introduced  dexa repeated in Jan; does not want to take meds  Dr. Selena Batten discussed Vit D   Signed beneficiary notice for LDCT to follow up on pulmonary nodule    Abnormal Screens  Had pulmonary nodule; signed ABN for LDCT     Referrals  none  Patient concerns; As noted  Nurse Concerns; As noted  Next PCP apt Was seen today       I have personally reviewed and noted the following in the patient's chart:   . Medical and social history . Use of alcohol, tobacco or illicit drugs  . Current medications and supplements . Functional ability and status . Nutritional status . Physical activity . Advanced directives . List of other physicians . Hospitalizations, surgeries, and ER visits in previous 12 months . Vitals . Screenings to include cognitive, depression, and  falls . Referrals and appointments  In addition, I have reviewed and discussed with patient certain preventive protocols, quality metrics, and best practice recommendations. A written personalized care plan for preventive services as well as general preventive health recommendations were provided to patient.     Montine Circle, RN  02/10/2018

## 2018-02-10 ENCOUNTER — Ambulatory Visit (INDEPENDENT_AMBULATORY_CARE_PROVIDER_SITE_OTHER): Payer: Medicare Other

## 2018-02-10 ENCOUNTER — Ambulatory Visit (INDEPENDENT_AMBULATORY_CARE_PROVIDER_SITE_OTHER): Payer: Medicare Other | Admitting: Family Medicine

## 2018-02-10 ENCOUNTER — Encounter: Payer: Self-pay | Admitting: Family Medicine

## 2018-02-10 VITALS — BP 98/58 | HR 75 | Temp 98.1°F | Ht 62.0 in | Wt 189.0 lb

## 2018-02-10 DIAGNOSIS — Z23 Encounter for immunization: Secondary | ICD-10-CM

## 2018-02-10 DIAGNOSIS — Z6834 Body mass index (BMI) 34.0-34.9, adult: Secondary | ICD-10-CM

## 2018-02-10 DIAGNOSIS — M8580 Other specified disorders of bone density and structure, unspecified site: Secondary | ICD-10-CM | POA: Diagnosis not present

## 2018-02-10 DIAGNOSIS — Z0000 Encounter for general adult medical examination without abnormal findings: Secondary | ICD-10-CM

## 2018-02-10 DIAGNOSIS — R911 Solitary pulmonary nodule: Secondary | ICD-10-CM | POA: Diagnosis not present

## 2018-02-10 DIAGNOSIS — E669 Obesity, unspecified: Secondary | ICD-10-CM

## 2018-02-10 DIAGNOSIS — R918 Other nonspecific abnormal finding of lung field: Secondary | ICD-10-CM

## 2018-02-10 NOTE — Patient Instructions (Addendum)
Monica Lamb , Thank you for taking time to come for your Medicare Wellness Visit. I appreciate your ongoing commitment to your health goals. Please review the following plan we discussed and let me know if I can assist you in the future.   Took your high dose flu vaccine today   Dr. Maudie Mercury will order the LDCT you should get a call to schedule  Make an apt with Mechele Claude to get your Tdap A Tetanus is recommended every 10 years. Medicare covers a tetanus if you have a cut or wound; otherwise, there may be a charge. If you had not had a tetanus with pertusses, known as the Tdap, you can take this anytime.     Shingrix is a vaccine for the prevention of Shingles in Adults 50 and older.  If you are on Medicare, the shingrix is covered under your Part D plan, so you will take both of the vaccines in the series at your pharmacy. Please check with your benefits regarding applicable copays or out of pocket expenses.  The Shingrix is given in 2 vaccines approx 8 weeks apart. You must receive the 2nd dose prior to 6 months from receipt of the first. Please have the pharmacist print out you Immunization  dates for our office records     These are the goals we discussed: Goals    . patient     Start to plan trips;  Go back to the [facility] when symptoms resolve     . patient     [facility] the [facility]     . Patient Stated     Goal will be to go to [place]        This is a list of the screening recommended for you and due dates:  Health Maintenance  Topic Date Due  . Tetanus Vaccine  08/11/2018*  . Colon Cancer Screening  08/07/2026  . Flu Shot  Completed  . DEXA scan (bone density measurement)  Completed  . Pneumonia vaccines  Completed  *Topic was postponed. The date shown is not the original due date.      Fall Prevention in the Home Falls can cause injuries. They can happen to people of all ages. There are many things you can do to make your home safe and to  help prevent falls. What can I do on the outside of my home?  Regularly fix the edges of walkways and driveways and fix any cracks.  Remove anything that might make you trip as you walk through a door, such as a raised step or threshold.  Trim any bushes or trees on the path to your home.  Use bright outdoor lighting.  Clear any walking paths of anything that might make someone trip, such as rocks or tools.  Regularly check to see if handrails are loose or broken. Make sure that both sides of any steps have handrails.  Any raised decks and porches should have guardrails on the edges.  Have any leaves, snow, or ice cleared regularly.  Use sand or salt on walking paths during winter.  Clean up any spills in your garage right away. This includes oil or grease spills. What can I do in the bathroom?  Use night lights.  Install grab bars by the toilet and in the tub and shower. Do not use towel bars as grab bars.  Use non-skid mats or decals in the tub or shower.  If you need to  sit down in the shower, use a plastic, non-slip stool.  Keep the floor dry. Clean up any water that spills on the floor as soon as it happens.  Remove soap buildup in the tub or shower regularly.  Attach bath mats securely with double-sided non-slip rug tape.  Do not have throw rugs and other things on the floor that can make you trip. What can I do in the bedroom?  Use night lights.  Make sure that you have a light by your bed that is easy to reach.  Do not use any sheets or blankets that are too big for your bed. They should not hang down onto the floor.  Have a firm chair that has side arms. You can use this for support while you get dressed.  Do not have throw rugs and other things on the floor that can make you trip. What can I do in the kitchen?  Clean up any spills right away.  Avoid walking on wet floors.  Keep items that you use a lot in easy-to-reach places.  If you need to reach  something above you, use a strong step stool that has a grab bar.  Keep electrical cords out of the way.  Do not use floor polish or wax that makes floors slippery. If you must use wax, use non-skid floor wax.  Do not have throw rugs and other things on the floor that can make you trip. What can I do with my stairs?  Do not leave any items on the stairs.  Make sure that there are handrails on both sides of the stairs and use them. Fix handrails that are broken or loose. Make sure that handrails are as long as the stairways.  Check any carpeting to make sure that it is firmly attached to the stairs. Fix any carpet that is loose or worn.  Avoid having throw rugs at the top or bottom of the stairs. If you do have throw rugs, attach them to the floor with carpet tape.  Make sure that you have a light switch at the top of the stairs and the bottom of the stairs. If you do not have them, ask someone to add them for you. What else can I do to help prevent falls?  Wear shoes that: ? Do not have high heels. ? Have rubber bottoms. ? Are comfortable and fit you well. ? Are closed at the toe. Do not wear sandals.  If you use a stepladder: ? Make sure that it is fully opened. Do not climb a closed stepladder. ? Make sure that both sides of the stepladder are locked into place. ? Ask someone to hold it for you, if possible.  Clearly mark and make sure that you can see: ? Any grab bars or handrails. ? First and last steps. ? Where the edge of each step is.  Use tools that help you move around (mobility aids) if they are needed. These include: ? Canes. ? Walkers. ? Scooters. ? Crutches.  Turn on the lights when you go into a dark area. Replace any light bulbs as soon as they burn out.  Set up your furniture so you have a clear path. Avoid moving your furniture around.  If any of your floors are uneven, fix them.  If there are any pets around you, be aware of where they are.  Review  your medicines with your doctor. Some medicines can make you feel dizzy. This can increase your chance  of falling. Ask your doctor what other things that you can do to help prevent falls. This information is not intended to replace advice given to you by your health care provider. Make sure you discuss any questions you have with your health care provider. Document Released: 01/12/2009 Document Revised: 08/24/2015 Document Reviewed: 04/22/2014 Elsevier Interactive Patient Education  2018 [place] Maintenance, Female Adopting a healthy lifestyle and getting preventive care can go a long way to promote health and wellness. Talk with your health care provider about what schedule of regular examinations is right for you. This is a good chance for you to check in with your provider about disease prevention and staying healthy. In between checkups, there are plenty of things you can do on your own. Experts have done a lot of research about which lifestyle changes and preventive measures are most likely to keep you healthy. Ask your health care provider for more information. Weight and diet Eat a healthy diet  Be sure to include plenty of vegetables, fruits, low-fat dairy products, and lean protein.  Do not eat a lot of foods high in solid fats, added sugars, or salt.  Get regular exercise. This is one of the most important things you can do for your health. ? Most adults should exercise for at least 150 minutes each week. The exercise should increase your heart rate and make you sweat (moderate-intensity exercise). ? Most adults should also do strengthening exercises at least twice a week. This is in addition to the moderate-intensity exercise.  Maintain a healthy weight  Body mass index (BMI) is a measurement that can be used to identify possible weight problems. It estimates body fat based on height and weight. Your health care provider can help determine your BMI and help you achieve  or maintain a healthy weight.  For females 75 years of age and older: ? A BMI below 18.5 is considered underweight. ? A BMI of 18.5 to 24.9 is normal. ? A BMI of 25 to 29.9 is considered overweight. ? A BMI of 30 and above is considered obese.  Watch levels of cholesterol and blood lipids  You should start having your blood tested for lipids and cholesterol at 75 years of age, then have this test every 5 years.  You may need to have your cholesterol levels checked more often if: ? Your lipid or cholesterol levels are high. ? You are older than 75 years of age. ? You are at high risk for heart disease.  Cancer screening Lung Cancer  Lung cancer screening is recommended for adults 38-32 years old who are at high risk for lung cancer because of a history of smoking.  A yearly low-dose CT scan of the lungs is recommended for people who: ? Currently smoke. ? Have quit within the past 15 years. ? Have at least a 30-pack-year history of smoking. A pack year is smoking an average of one pack of cigarettes a day for 1 year.  Yearly screening should continue until it has been 15 years since you quit.  Yearly screening should stop if you develop a health problem that would prevent you from having lung cancer treatment.  Breast Cancer  Practice breast self-awareness. This means understanding how your breasts normally appear and feel.  It also means doing regular breast self-exams. Let your health care provider know about any changes, no matter how small.  If you are in your 20s or 30s, you should have a clinical breast exam (  CBE) by a health care provider every 1-3 years as part of a regular health exam.  If you are 44 or older, have a CBE every year. Also consider having a breast X-ray (mammogram) every year.  If you have a family history of breast cancer, talk to your health care provider about genetic screening.  If you are at high risk for breast cancer, talk to your health care  provider about having an MRI and a mammogram every year.  Breast cancer gene (BRCA) assessment is recommended for women who have family members with BRCA-related cancers. BRCA-related cancers include: ? Breast. ? Ovarian. ? Tubal. ? Peritoneal cancers.  Results of the assessment will determine the need for genetic counseling and BRCA1 and BRCA2 testing.  Cervical Cancer Your health care provider may recommend that you be screened regularly for cancer of the pelvic organs (ovaries, uterus, and vagina). This screening involves a pelvic examination, including checking for microscopic changes to the surface of your cervix (Pap test). You may be encouraged to have this screening done every 3 years, beginning at age 76.  For women ages 90-65, health care providers may recommend pelvic exams and Pap testing every 3 years, or they may recommend the Pap and pelvic exam, combined with testing for human papilloma virus (HPV), every 5 years. Some types of HPV increase your risk of cervical cancer. Testing for HPV may also be done on women of any age with unclear Pap test results.  Other health care providers may not recommend any screening for nonpregnant women who are considered low risk for pelvic cancer and who do not have symptoms. Ask your health care provider if a screening pelvic exam is right for you.  If you have had past treatment for cervical cancer or a condition that could lead to cancer, you need Pap tests and screening for cancer for at least 20 years after your treatment. If Pap tests have been discontinued, your risk factors (such as having a new sexual partner) need to be reassessed to determine if screening should resume. Some women have medical problems that increase the chance of getting cervical cancer. In these cases, your health care provider may recommend more frequent screening and Pap tests.  Colorectal Cancer  This type of cancer can be detected and often prevented.  Routine  colorectal cancer screening usually begins at 75 years of age and continues through 75 years of age.  Your health care provider may recommend screening at an earlier age if you have risk factors for colon cancer.  Your health care provider may also recommend using home test kits to check for hidden blood in the stool.  A small camera at the end of a tube can be used to examine your colon directly (sigmoidoscopy or colonoscopy). This is done to check for the earliest forms of colorectal cancer.  Routine screening usually begins at age 61.  Direct examination of the colon should be repeated every 5-10 years through 76 years of age. However, you may need to be screened more often if early forms of precancerous polyps or small growths are found.  Skin Cancer  Check your skin from head to toe regularly.  Tell your health care provider about any new moles or changes in moles, especially if there is a change in a mole's shape or color.  Also tell your health care provider if you have a mole that is larger than the size of a pencil eraser.  Always use sunscreen. Apply sunscreen liberally  and repeatedly throughout the day.  Protect yourself by wearing long sleeves, pants, a wide-brimmed hat, and sunglasses whenever you are outside.  Heart disease, diabetes, and high blood pressure  High blood pressure causes heart disease and increases the risk of stroke. High blood pressure is more likely to develop in: ? People who have blood pressure in the high end of the normal range (130-139/85-89 mm Hg). ? People who are overweight or obese. ? People who are African American.  If you are 3-80 years of age, have your blood pressure checked every 3-5 years. If you are 63 years of age or older, have your blood pressure checked every year. You should have your blood pressure measured twice-once when you are at a hospital or clinic, and once when you are not at a hospital or clinic. Record the average of the  two measurements. To check your blood pressure when you are not at a hospital or clinic, you can use: ? An automated blood pressure machine at a pharmacy. ? A home blood pressure monitor.  If you are between 59 years and 15 years old, ask your health care provider if you should take aspirin to prevent strokes.  Have regular diabetes screenings. This involves taking a blood sample to check your fasting blood sugar level. ? If you are at a normal weight and have a low risk for diabetes, have this test once every three years after 75 years of age. ? If you are overweight and have a high risk for diabetes, consider being tested at a younger age or more often. Preventing infection Hepatitis B  If you have a higher risk for hepatitis B, you should be screened for this virus. You are considered at high risk for hepatitis B if: ? You were born in a country where hepatitis B is common. Ask your health care provider which countries are considered high risk. ? Your parents were born in a high-risk country, and you have not been immunized against hepatitis B (hepatitis B vaccine). ? You have HIV or AIDS. ? You use needles to inject street drugs. ? You live with someone who has hepatitis B. ? You have had sex with someone who has hepatitis B. ? You get hemodialysis treatment. ? You take certain medicines for conditions, including cancer, organ transplantation, and autoimmune conditions.  Hepatitis C  Blood testing is recommended for: ? Everyone born from [birth year] through 1963. ? Anyone with known risk factors for hepatitis C.  Sexually transmitted infections (STIs)  You should be screened for sexually transmitted infections (STIs) including gonorrhea and chlamydia if: ? You are sexually active and are younger than 75 years of age. ? You are older than 75 years of age and your health care provider tells you that you are at risk for this type of infection. ? Your sexual activity has changed since you  were last screened and you are at an increased risk for chlamydia or gonorrhea. Ask your health care provider if you are at risk.  If you do not have HIV, but are at risk, it may be recommended that you take a prescription medicine daily to prevent HIV infection. This is called pre-exposure prophylaxis (PrEP). You are considered at risk if: ? You are sexually active and do not regularly use condoms or know the HIV status of your partner(s). ? You take drugs by injection. ? You are sexually active with a partner who has HIV.  Talk with your health care provider about whether  you are at high risk of being infected with HIV. If you choose to begin PrEP, you should first be tested for HIV. You should then be tested every 3 months for as long as you are taking PrEP. Pregnancy  If you are premenopausal and you may become pregnant, ask your health care provider about preconception counseling.  If you may become pregnant, take 400 to 800 micrograms (mcg) of folic acid every day.  If you want to prevent pregnancy, talk to your health care provider about birth control (contraception). Osteoporosis and menopause  Osteoporosis is a disease in which the bones lose minerals and strength with aging. This can result in serious bone fractures. Your risk for osteoporosis can be identified using a bone density scan.  If you are 37 years of age or older, or if you are at risk for osteoporosis and fractures, ask your health care provider if you should be screened.  Ask your health care provider whether you should take a calcium or vitamin D supplement to lower your risk for osteoporosis.  Menopause may have certain physical symptoms and risks.  Hormone replacement therapy may reduce some of these symptoms and risks. Talk to your health care provider about whether hormone replacement therapy is right for you. Follow these instructions at home:  Schedule regular health, dental, and eye exams.  Stay current  with your immunizations.  Do not use any tobacco products including cigarettes, chewing tobacco, or electronic cigarettes.  If you are pregnant, do not drink alcohol.  If you are breastfeeding, limit how much and how often you drink alcohol.  Limit alcohol intake to no more than 1 drink per day for nonpregnant women. One drink equals 12 ounces of beer, 5 ounces of wine, or 1 ounces of hard liquor.  Do not use street drugs.  Do not share needles.  Ask your health care provider for help if you need support or information about quitting drugs.  Tell your health care provider if you often feel depressed.  Tell your health care provider if you have ever been abused or do not feel safe at home. This information is not intended to replace advice given to you by your health care provider. Make sure you discuss any questions you have with your health care provider. Document Released: 10/01/2010 Document Revised: 08/24/2015 Document Reviewed: 12/20/2014 Elsevier Interactive Patient Education  [employer].

## 2018-02-10 NOTE — Patient Instructions (Signed)
BEFORE YOU LEAVE: -flu shot -order CT for pulm nodule -schedule nurse visit for tetanus booster after thanksgiving per her preference -follow up: yearly for follow up and AWV  -We placed a referral for you as discussed for the pulm nodule. It usually takes about 1-2 weeks to process and schedule this referral. If you have not heard from us regarding this appointment in 2 weeks please contact our office.    We recommend the following healthy lifestyle for LIFE: 1) Small portions. But, make sure to get regular (at least 3 per day), healthy meals and small healthy snacks if needed.  2) Eat a healthy clean diet.   TRY TO EAT: -at least 5-7 servings of low sugar, colorful, and nutrient rich vegetables per day (not corn, potatoes or bananas.) -berries are the best choice if you wish to eat fruit (only eat small amounts if trying to reduce weight)  -lean meets (fish, white meat of chicken or [place]) -vegan proteins for some meals - beans or tofu, whole grains, nuts and seeds -Replace bad fats with good fats - good fats include: fish, nuts and seeds, canola oil, olive oil -small amounts of low fat or non fat dairy -small amounts of100 % whole grains - check the lables -drink plenty of water  AVOID: -SUGAR, sweets, anything with added sugar, corn syrup or sweeteners - must read labels as even foods advertised as "healthy" often are loaded with sugar -if you must have a sweetener, small amounts of stevia may be best -sweetened beverages and artificially sweetened beverages -simple starches (rice, bread, potatoes, pasta, chips, etc - small amounts of 100% whole grains are ok) -red meat, pork, butter -fried foods, fast food, processed food, excessive dairy, eggs and coconut.  3)Get at least 150 minutes of sweaty aerobic exercise per week.  4)Reduce stress - consider counseling, meditation and relaxation to balance other aspects of your life.

## 2018-02-10 NOTE — Progress Notes (Addendum)
HPI:  Using dictation device. Unfortunately this device frequently misinterprets words/phrases.  Monica Lamb is a pleasant 77 y.o. here for follow up. Chronic medical problems summarized below were reviewed for changes and stability and were updated as needed below. These issues and their treatment remain stable for the most part. Reports is doing well.  [place] returned from [place] to [place] and really enjoyed it.  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  She continues to take vitamin D and get weightbearing exercise for the osteopenia, she does not wish to take any medications for this.  We re-addressed the issue of a history of pulmonary nodules.  She has changed her mind and does want us to go ahead and order a CT scan for this today.  No symptoms.  No chronic cough, shortness of breath, fatigue or weight loss.  She has no smoking history.  However, her father smoked going up.  Her sister had lung cancer even though she was not a long-term smoker.  Denies CP, SOB, DOE, treatment intolerance or new symptoms. Seeing Montine CircleSusan Hauck for her annual wellness visit today. Due for tetanus vaccine and flu vaccine  Hx Osteopenia: -s/p 2 years fosamax in the past -used to see Dr. 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 Juliene PinaMody for gyn care.   ROS: See pertinent positives and negatives per HPI.  Past Medical History:  Diagnosis Date  . Allergy   . C. difficile diarrhea   . Cataract    removed x 2   . Glaucoma   . Osteopenia    took fosomax remotely for a few years- past hx-  . Rosacea    -had surgery with Dr. Roda Shuttersaswell remortely; sees Dr. Dagoberto LigasStoneburner     Past Surgical History:  Procedure Laterality Date  . CATARACT EXTRACTION, BILATERAL    . COLONOSCOPY N/A 08/06/2016   Procedure: COLONOSCOPY;  Surgeon: Iva BoopGessner, Carl E, MD;  Location: [facility];  Service: Endoscopy;  Laterality: N/A;  . FECAL TRANSPLANT N/A 08/06/2016   Procedure: FECAL TRANSPLANT;  Surgeon: Iva BoopGessner, Carl E, MD;  Location: [facility];  Service: Endoscopy;  Laterality: N/A;  . LYMPH NODE BIOPSY     benign per patient  . lymphnode biopsy    . TUBAL LIGATION      Family History  Problem Relation Age of Onset  . Arthritis Mother   . Hypertension Mother   . Macular degeneration Mother   . Cancer Father   . Lung cancer Father   . Arthritis Sister   . Hypertension Sister   . Cancer Maternal Aunt   . Lung cancer Sister   . Colon cancer Neg Hx   . Colon polyps Neg Hx   . Esophageal cancer Neg Hx   . Stomach cancer Neg Hx   . Rectal cancer Neg Hx     SOCIAL HX: see hpi   Current Outpatient Medications:  .  acetaminophen (TYLENOL) 500 MG tablet, Take 500 mg by mouth every 6 (six) hours as needed for mild pain or headache., Disp: , Rfl:  .  cholecalciferol (VITAMIN D) 1000 units tablet, Take 1,000 Units by mouth daily., Disp: , Rfl:  .  metroNIDAZOLE (METROCREAM)  0.75 % cream, Apply 1 application topically daily. For rosacea, Disp: , Rfl:  .  Phenylephrine-Acetaminophen (TYLENOL SINUS+HEADACHE PO), Take 1 tablet by mouth daily as needed (SINUS HEADACHE)., Disp: , Rfl:  .  Polyethyl Glycol-Propyl Glycol (SYSTANE OP), Apply 1 drop to eye daily as needed (DRY EYES)., Disp: , Rfl:   EXAM:  Vitals:   02/10/18 1351  BP: (!) 98/58  Pulse: 75  Temp: 98.1 F (36.7 C)    Body mass index is 34.57 kg/m.  GENERAL: vitals reviewed and listed above, alert, oriented, appears well hydrated and in no acute distress  HEENT: atraumatic, conjunttiva clear, no obvious abnormalities on inspection of external nose and ears  NECK: no obvious masses on inspection  LUNGS:  clear to auscultation bilaterally, no wheezes, rales or rhonchi, good air movement  CV: HRRR, no peripheral edema  MS: moves all extremities without noticeable abnormality  PSYCH: pleasant and cooperative, no obvious depression or anxiety  ASSESSMENT AND PLAN:  Discussed the following assessment and plan:  Solitary pulmonary nodule -she wanted to do the 12 month follow up given her hx 2nd hand smoke exposure and sister with lung cancer -it appears her insurance is denying coverage for this, pt notified, waiver printed to sign  Osteopenia, unspecified location -declined other treatment, or re-eval  Obesity - BMI 34.0-34.9,adult - lifestyle recs  -Patient advised to return or notify a doctor immediately if symptoms worsen or persist or new concerns arise.  Patient Instructions  BEFORE YOU LEAVE: -flu shot -order CT for pulm nodule -schedule nurse visit for tetanus booster after thanksgiving per her preference -follow up: yearly for follow up and AWV  -We placed a referral for you as discussed for the pulm nodule. It usually takes about 1-2 weeks to process and schedule this referral. If you have not heard from [place] regarding this appointment in 2 weeks please contact our office.    We recommend the following healthy lifestyle for LIFE: 1) Small portions. But, make sure to get regular (at least 3 per day), healthy meals and small healthy snacks if needed.  2) Eat a healthy clean diet.   TRY TO EAT: -at least 5-7 servings of low sugar, colorful, and nutrient rich vegetables per day (not corn, potatoes or bananas.) -berries are the best choice if you wish to eat fruit (only eat small amounts if trying to reduce weight)  -lean meets (fish, white meat of chicken or [place]) -vegan proteins for some meals - beans or tofu, whole grains, nuts and seeds -Replace bad fats with good fats - good fats include: fish, nuts and seeds, canola oil, olive oil -small amounts of low fat or non fat  dairy -small amounts of100 % whole grains - check the lables -drink plenty of water  AVOID: -SUGAR, sweets, anything with added sugar, corn syrup or sweeteners - must read labels as even foods advertised as "healthy" often are loaded with sugar -if you must have a sweetener, small amounts of stevia may be best -sweetened beverages and artificially sweetened beverages -simple starches (rice, bread, potatoes, pasta, chips, etc - small amounts of 100% whole grains are ok) -red meat, pork, butter -fried foods, fast food, processed food, excessive dairy, eggs and coconut.  3)Get at least 150 minutes of sweaty aerobic exercise per week.  4)Reduce stress - consider counseling, meditation and relaxation to balance other aspects of your life.       [name], DO

## 2018-02-16 NOTE — Progress Notes (Signed)
Monica Lamb R [name], DO  

## 2018-02-17 ENCOUNTER — Ambulatory Visit
Admission: RE | Admit: 2018-02-17 | Discharge: 2018-02-17 | Disposition: A | Discharge status: Home / Self Care | Payer: Medicare Other | Source: Ambulatory Visit | Attending: Family Medicine | Admitting: Family Medicine

## 2018-02-17 DIAGNOSIS — R911 Solitary pulmonary nodule: Secondary | ICD-10-CM

## 2018-02-18 ENCOUNTER — Encounter: Payer: Self-pay | Admitting: Family Medicine

## 2018-02-18 DIAGNOSIS — I700 Atherosclerosis of aorta: Secondary | ICD-10-CM | POA: Insufficient documentation

## 2018-02-18 HISTORY — DX: Atherosclerosis of aorta: I70.0

## 2018-02-19 ENCOUNTER — Telehealth: Payer: Self-pay | Admitting: Family Medicine

## 2018-02-19 NOTE — Telephone Encounter (Signed)
See result note.  

## 2018-02-19 NOTE — Telephone Encounter (Signed)
Copied from CRM [id number]. Topic: Quick Communication - Lab Results (Clinic Use ONLY) >> Feb 19, 2018  3:44 PM Johnella MoloneyFunderburk, Jo A, CMA wrote: Called patient to inform them of lab results. When patient returns call, triage nurse may disclose results. >> Feb 19, 2018  4:55 PM Laural BenesJohnson, Louisianahiquita C wrote: Pt is returning call for results.

## 2018-03-09 ENCOUNTER — Ambulatory Visit (INDEPENDENT_AMBULATORY_CARE_PROVIDER_SITE_OTHER): Payer: Medicare Other | Admitting: *Deleted

## 2018-03-09 DIAGNOSIS — Z23 Encounter for immunization: Secondary | ICD-10-CM

## 2018-11-20 ENCOUNTER — Other Ambulatory Visit: Payer: Self-pay | Admitting: Obstetrics & Gynecology

## 2018-11-20 DIAGNOSIS — Z1231 Encounter for screening mammogram for malignant neoplasm of breast: Secondary | ICD-10-CM

## 2018-11-23 ENCOUNTER — Other Ambulatory Visit: Payer: Self-pay

## 2018-11-23 ENCOUNTER — Ambulatory Visit
Admission: RE | Admit: 2018-11-23 | Discharge: 2018-11-23 | Disposition: A | Discharge status: Home / Self Care | Payer: Medicare Other | Source: Ambulatory Visit | Attending: Obstetrics & Gynecology | Admitting: Obstetrics & Gynecology

## 2018-11-23 DIAGNOSIS — Z1231 Encounter for screening mammogram for malignant neoplasm of breast: Secondary | ICD-10-CM

## 2019-02-03 ENCOUNTER — Other Ambulatory Visit: Payer: Self-pay

## 2019-02-03 ENCOUNTER — Encounter: Payer: Self-pay | Admitting: Internal Medicine

## 2019-02-03 ENCOUNTER — Ambulatory Visit (INDEPENDENT_AMBULATORY_CARE_PROVIDER_SITE_OTHER): Payer: Medicare Other | Admitting: Internal Medicine

## 2019-02-03 VITALS — BP 140/98 | HR 78 | Temp 97.3°F | Ht 62.0 in | Wt 195.2 lb

## 2019-02-03 DIAGNOSIS — Z23 Encounter for immunization: Secondary | ICD-10-CM | POA: Diagnosis not present

## 2019-02-03 DIAGNOSIS — E559 Vitamin D deficiency, unspecified: Secondary | ICD-10-CM

## 2019-02-03 DIAGNOSIS — I10 Essential (primary) hypertension: Secondary | ICD-10-CM | POA: Diagnosis not present

## 2019-02-03 DIAGNOSIS — M8580 Other specified disorders of bone density and structure, unspecified site: Secondary | ICD-10-CM

## 2019-02-03 NOTE — Progress Notes (Signed)
Established Patient Office Visit     CC/Reason for Visit: Establish care, discuss chronic conditions  HPI: Monica Lamb is a 78 y.o. female who is coming in today for the above mentioned reasons. Past Medical History is significant for: Vitamin D deficiency, osteopenia.  She had a very serious bout of C. difficile spanning from summer 2017 through May 2018 after she received clindamycin for a dental abscess.  She was cared for by Dr. Delle Reining and 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  She remains very wary of antibiotics and would like to let me know that upfront.   Past Medical/Surgical History: Past Medical History:  Diagnosis Date  . Allergy   . Atherosclerosis of aorta (HCC) 02/18/2018  . C. difficile diarrhea   . Cataract    removed x 2   . Glaucoma   . Osteopenia    took fosomax remotely for a few years- past hx-  . 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 Rosacea    -had surgery with Dr. Roda Shutters remortely; sees Dr. Dagoberto Ligas  . Solitary pulmonary nodule 02/06/2016   -solitary pulm nodule, incidental finding on abd CT in 2016 -she discussed with ID and me and was indecisive about whether or not to do a repeat 12 month CT scan, she ultimately decided to hold off on the order, but to call us if she changed her mind  -completed follow up per radiology recommendations in 2019    Past Surgical History:  Procedure Laterality Date  . CATARACT EXTRACTION, BILATERAL    . COLONOSCOPY N/A 08/06/2016   Procedure: COLONOSCOPY;  Surgeon: Iva Boop, MD;  Location: [facility];  Service: Endoscopy;  Laterality: N/A;  . 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 LYMPH NODE BIOPSY     benign per patient  . lymphnode biopsy    . TUBAL LIGATION      Social History:  reports  that she has never smoked. She has never used smokeless tobacco. She reports that she does not drink alcohol or use drugs.  Allergies: Allergies  Allergen Reactions  . Clindamycin/Lincomycin     C-diff per patient  . 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 Penicillins Rash    Has patient had a PCN reaction causing immediate rash, facial/tongue/throat swelling, SOB or lightheadedness with hypotension: No Has patient had a PCN reaction causing severe rash involving mucus membranes or skin necrosis: No Has patient had a PCN reaction that required hospitalization No Has patient had a PCN reaction occurring within the last 10 years: No If all of the above answers are "NO", then may proceed with Cephalosporin use.     Family History:  Family History  Problem Relation Age of Onset  . Arthritis Mother   . Hypertension Mother   . Macular degeneration Mother   . Cancer Father   . Lung cancer Father   . Arthritis Sister   . Hypertension Sister   . Cancer Maternal Aunt   . Lung cancer Sister   . Colon cancer Neg Hx   . Colon polyps Neg Hx   . Esophageal cancer Neg Hx   .  Stomach cancer Neg Hx   . Rectal cancer Neg Hx      Current Outpatient Medications:  .  acetaminophen (TYLENOL) 500 MG tablet, Take 500 mg by mouth every 6 (six) hours as needed for mild pain or headache., Disp: , Rfl:  .  cholecalciferol (VITAMIN D) 1000 units tablet, Take 1,000 Units by mouth daily., Disp: , Rfl:  .  metroNIDAZOLE (METROCREAM) 0.75 % cream, Apply 1 application topically daily. For rosacea, Disp: , Rfl:  .  Multiple Vitamins-Minerals (ICAPS AREDS 2 PO), Take by mouth 2 (two) times daily., Disp: , Rfl:  .  Phenylephrine-Acetaminophen (TYLENOL SINUS+HEADACHE PO),  Take 1 tablet by mouth daily as needed (SINUS HEADACHE)., Disp: , Rfl:  .  Polyethyl Glycol-Propyl Glycol (SYSTANE OP), Apply 1 drop to eye daily as needed (DRY EYES)., Disp: , Rfl:   Review of Systems:  Constitutional: Denies fever, chills, diaphoresis, appetite change and fatigue.  HEENT: Denies photophobia, eye pain, redness, hearing loss, ear pain, congestion, sore throat, rhinorrhea, sneezing, mouth sores, trouble swallowing, neck pain, neck stiffness and tinnitus.   Respiratory: Denies SOB, DOE, cough, chest tightness,  and wheezing.   Cardiovascular: Denies chest pain, palpitations and leg swelling.  Gastrointestinal: Denies nausea, vomiting, abdominal pain, diarrhea, constipation, blood in stool and abdominal distention.  Genitourinary: Denies dysuria, urgency, frequency, hematuria, flank pain and difficulty urinating.  Endocrine: Denies: hot or cold intolerance, sweats, changes in hair or nails, polyuria, polydipsia. Musculoskeletal: Denies myalgias, back pain, joint swelling, arthralgias and gait problem.  Skin: Denies pallor, rash and wound.  Neurological: Denies dizziness, seizures, syncope, weakness, light-headedness, numbness and headaches.  Hematological: Denies adenopathy. Easy bruising, personal or family bleeding history  Psychiatric/Behavioral: Denies suicidal ideation, mood changes, confusion, nervousness, sleep disturbance and agitation    Physical Exam: Vitals:   02/03/19 0944  BP: (!) 140/98  Pulse: 78  Temp: (!) 97.3 F (36.3 C)  TempSrc: Temporal  SpO2: 97%  Weight: 195 lb 3.2 oz (88.5 kg)  Height: 5\' 2"  (1.575 m)    Body mass index is 35.7 kg/m.   Constitutional: NAD, calm, comfortable, obese Eyes: PERRL, lids and conjunctivae normal ENMT: Mucous membranes are moist.  Neck: normal, supple, no masses, no thyromegaly Respiratory: clear to auscultation bilaterally, no wheezing, no crackles. Normal respiratory effort. No accessory muscle use.   Cardiovascular: Regular rate and rhythm, no murmurs / rubs / gallops. No extremity edema. 2+ pedal pulses.  Abdomen: no tenderness, no masses palpated. No hepatosplenomegaly. Bowel sounds positive.  Musculoskeletal: no clubbing / cyanosis. No joint deformity upper and lower extremities. Good ROM, no contractures. Normal muscle tone.  Skin: no rashes, lesions, ulcers. No induration Neurologic: Grossly intact and nonfocal Psychiatric: Normal judgment and insight. Alert and oriented x 3. Normal mood.    Impression and Plan:  Elevated blood pressure reading with diagnosis of hypertension -She will do ambulatory blood pressure monitoring and return for follow-up.  Osteopenia, unspecified location -Due for bone density in January 2021. -She was briefly on Fosamax as she had a fracture in the past.  She did not like it and is concerned about osteonecrosis potential.  Vitamin D deficiency -Check levels when she returns for physical.  Flu vaccine administered today.    Patient Instructions  -Nice seeing you today!!  -Flu vaccine today.  -Schedule follow up for wellness visit. Please come in fasting that day.     Lelon Frohlich, MD [facility]

## 2019-02-03 NOTE — Patient Instructions (Signed)
-  Nice seeing you today!!  -Flu vaccine today.  -Schedule follow up for wellness visit. Please come in fasting that day.

## 2019-03-11 ENCOUNTER — Encounter: Payer: Self-pay | Admitting: Internal Medicine

## 2019-03-11 ENCOUNTER — Other Ambulatory Visit: Payer: Self-pay | Admitting: Internal Medicine

## 2019-03-11 ENCOUNTER — Other Ambulatory Visit: Payer: Self-pay

## 2019-03-11 ENCOUNTER — Ambulatory Visit (INDEPENDENT_AMBULATORY_CARE_PROVIDER_SITE_OTHER): Payer: Medicare Other | Admitting: Internal Medicine

## 2019-03-11 VITALS — BP 124/68 | HR 105 | Temp 97.6°F | Wt 192.7 lb

## 2019-03-11 DIAGNOSIS — M8580 Other specified disorders of bone density and structure, unspecified site: Secondary | ICD-10-CM

## 2019-03-11 DIAGNOSIS — E559 Vitamin D deficiency, unspecified: Secondary | ICD-10-CM

## 2019-03-11 DIAGNOSIS — Z6834 Body mass index (BMI) 34.0-34.9, adult: Secondary | ICD-10-CM

## 2019-03-11 DIAGNOSIS — E039 Hypothyroidism, unspecified: Secondary | ICD-10-CM | POA: Diagnosis not present

## 2019-03-11 DIAGNOSIS — Z0000 Encounter for general adult medical examination without abnormal findings: Secondary | ICD-10-CM | POA: Diagnosis not present

## 2019-03-11 DIAGNOSIS — Z8619 Personal history of other infectious and parasitic diseases: Secondary | ICD-10-CM

## 2019-03-11 DIAGNOSIS — E785 Hyperlipidemia, unspecified: Secondary | ICD-10-CM | POA: Diagnosis not present

## 2019-03-11 DIAGNOSIS — E538 Deficiency of other specified B group vitamins: Secondary | ICD-10-CM

## 2019-03-11 DIAGNOSIS — H9193 Unspecified hearing loss, bilateral: Secondary | ICD-10-CM

## 2019-03-11 LAB — COMPREHENSIVE METABOLIC PANEL
ALT: 11 U/L (ref 0–35)
AST: 15 U/L (ref 0–37)
Albumin: 4 g/dL (ref 3.5–5.2)
Alkaline Phosphatase: 78 U/L (ref 39–117)
BUN: 14 mg/dL (ref 6–23)
CO2: 29 mEq/L (ref 19–32)
Calcium: 9.2 mg/dL (ref 8.4–10.5)
Chloride: 102 mEq/L (ref 96–112)
Creatinine, Ser: 0.68 mg/dL (ref 0.40–1.20)
GFR: 84.05 mL/min (ref >60.00)
Glucose, Bld: 92 mg/dL (ref 70–99)
Potassium: 4.2 mEq/L (ref 3.5–5.1)
Sodium: 139 mEq/L (ref 135–145)
Total Bilirubin: 0.5 mg/dL (ref 0.2–1.2)
Total Protein: 6.8 g/dL (ref 6.0–8.3)

## 2019-03-11 LAB — TSH: TSH: 1.7 u[IU]/mL (ref 0.35–4.50)

## 2019-03-11 LAB — CBC WITH DIFFERENTIAL/PLATELET
Basophils Absolute: 0.1 10*3/uL (ref 0.0–0.1)
Basophils Relative: 0.8 % (ref 0.0–3.0)
Eosinophils Absolute: 0.1 10*3/uL (ref 0.0–0.7)
Eosinophils Relative: 1.1 % (ref 0.0–5.0)
HCT: 39.3 % (ref 36.0–46.0)
Hemoglobin: 12.8 g/dL (ref 12.0–15.0)
Lymphocytes Relative: 23 % (ref 12.0–46.0)
Lymphs Abs: 1.8 10*3/uL (ref 0.7–4.0)
MCHC: 32.7 g/dL (ref 30.0–36.0)
MCV: 85.6 fl (ref 78.0–100.0)
Monocytes Absolute: 0.5 10*3/uL (ref 0.1–1.0)
Monocytes Relative: 6.3 % (ref 3.0–12.0)
Neutro Abs: 5.5 10*3/uL (ref 1.4–7.7)
Neutrophils Relative %: 68.8 % (ref 43.0–77.0)
Platelets: 330 10*3/uL (ref 150.0–400.0)
RBC: 4.59 Mil/uL (ref 3.87–5.11)
RDW: 14.2 % (ref 11.5–15.5)
WBC: 8 10*3/uL (ref 4.0–10.5)

## 2019-03-11 LAB — VITAMIN B12: Vitamin B-12: 204 pg/mL — ABNORMAL LOW (ref 211–911)

## 2019-03-11 LAB — LIPID PANEL
Cholesterol: 174 mg/dL (ref 0–200)
HDL: 61.4 mg/dL (ref >39.00)
LDL Cholesterol: 95 mg/dL (ref 0–99)
NonHDL: 113.01
Total CHOL/HDL Ratio: 3
Triglycerides: 92 mg/dL (ref 0.0–149.0)
VLDL: 18.4 mg/dL (ref 0.0–40.0)

## 2019-03-11 LAB — VITAMIN D 25 HYDROXY (VIT D DEFICIENCY, FRACTURES): VITD: 29.22 ng/mL — ABNORMAL LOW (ref 30.00–100.00)

## 2019-03-11 MED ORDER — VITAMIN D (ERGOCALCIFEROL) 1.25 MG (50000 UNIT) PO CAPS: 50000.0000 [IU] | ORAL_CAPSULE | ORAL | 0 refills | Status: AC

## 2019-03-11 NOTE — Progress Notes (Signed)
Established Patient Office Visit     This visit occurred during the SARS-CoV-2 public health emergency.  Safety protocols were in place, including screening questions prior to the visit, additional usage of staff PPE, and extensive cleaning of exam room while observing appropriate contact time as indicated for disinfecting solutions.    CC/Reason for Visit: Subsequent Medicare wellness visit  HPI: Monica Lamb is a 78 y.o. female who is coming in today for the above mentioned reasons. 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  She has routine eye and dental care.  She has started noticing some hearing issues.  She does not do much in the way of exercising.  Her vaccinations are up-to-date with the exception of shingles which she was told she could not receive due to her Neosporin allergy (?).  About 5 weeks ago she started having itching and redness of her right eye.  She has followed with her ophthalmologist on multiple occasions, has had a couple antibiotic/steroid combinations and drops as well as olopatadine.  It has not affected her vision.  She was told she had a small corneal tear, is scheduled to see them again next week.  At her last visit to establish care she was noticed to have elevated blood pressure without a history of hypertension.  She has done ambulatory monitoring with results as below:  133/73 124/73 105/61 122/69 126/71 121/69 129/67  She has no acute complaints today.   Past Medical/Surgical History: Past Medical History:  Diagnosis Date  . Allergy   . Atherosclerosis of aorta (HCC) 02/18/2018  . C. difficile diarrhea   . Cataract    removed x 2   . Glaucoma   . Osteopenia    took fosomax remotely for a few years- past hx-  . 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 Rosacea    -had surgery with Dr. Roda Shutters remortely; sees Dr. Dagoberto Ligas  . Solitary pulmonary nodule 02/06/2016   -solitary pulm nodule, incidental finding on abd CT in 2016 -she discussed with ID and me and was indecisive about whether or not to do a repeat 12 month CT scan, she ultimately decided to hold off on the order, but to call us if she changed her mind  -completed follow up per radiology recommendations in 2019    Past Surgical History:  Procedure Laterality Date  . CATARACT EXTRACTION, BILATERAL    . COLONOSCOPY N/A 08/06/2016   Procedure: COLONOSCOPY;  Surgeon: Iva Boop, MD;  Location: [facility];  Service: Endoscopy;  Laterality: N/A;  . 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 LYMPH NODE BIOPSY     benign per patient  . lymphnode biopsy    . TUBAL LIGATION      Social History:  reports that she has never smoked. She has never used smokeless tobacco. She reports that she does not drink alcohol or use drugs.  Allergies: Allergies  Allergen Reactions  . Clindamycin/Lincomycin     C-diff per patient  . 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 Penicillins Rash    Has patient had a PCN reaction causing immediate rash, facial/tongue/throat swelling, SOB or lightheadedness with hypotension: No Has patient had a PCN reaction causing severe rash involving mucus membranes or skin necrosis: No Has patient had a PCN reaction that required hospitalization No Has patient had a PCN reaction occurring within the last 10 years: No If all of the above answers are "NO", then may proceed with  Cephalosporin use.     Family History:  Family History  Problem Relation Age of Onset  . Arthritis Mother   . Hypertension Mother   . Macular degeneration Mother   . Cancer Father   . Lung cancer Father   . Arthritis Sister   . Hypertension Sister   . Cancer Maternal Aunt   . Lung cancer Sister   . Colon cancer Neg Hx   . Colon polyps Neg Hx   . Esophageal cancer Neg Hx   . Stomach cancer Neg Hx   . Rectal cancer Neg Hx      Current Outpatient Medications:  .  acetaminophen (TYLENOL) 500 MG tablet, Take 500 mg by mouth every 6 (six) hours as needed for mild pain or headache., Disp: , Rfl:  .  cholecalciferol (VITAMIN D) 1000 units tablet, Take 1,000 Units by mouth daily., Disp: , Rfl:  .  metroNIDAZOLE (METROCREAM) 0.75 % cream, Apply 1 application topically daily. For rosacea, Disp: , Rfl:  .  Multiple Vitamins-Minerals (ICAPS AREDS 2 PO), Take by mouth 2 (two) times daily., Disp: , Rfl:  .  Phenylephrine-Acetaminophen (TYLENOL SINUS+HEADACHE PO), Take 1 tablet by mouth daily as needed (SINUS HEADACHE)., Disp: , Rfl:  .  Polyethyl Glycol-Propyl Glycol (SYSTANE OP), Apply 1 drop to eye daily as needed (DRY EYES)., Disp: , Rfl:  .  moxifloxacin (VIGAMOX) 0.5 % ophthalmic solution, Place 1 drop into the right eye 4 (four) times daily., Disp: , Rfl:  .  tobramycin-dexamethasone (TOBRADEX) ophthalmic solution, INSTILL 1 DROP INTO RIGHT EYE 4X DAILY FOR 5 DAYS THEN TWICE DAILY X 5 DAYS THEN STOP, Disp: , Rfl:   Review of Systems:  Constitutional: Denies fever, chills, diaphoresis, appetite change and fatigue.  HEENT: Denies photophobia, eye pain,  hearing loss, ear pain, congestion, sore throat, rhinorrhea, sneezing, mouth sores, trouble swallowing, neck pain, neck stiffness and tinnitus.   Respiratory: Denies SOB, DOE, cough, chest tightness,  and wheezing.   Cardiovascular: Denies chest pain, palpitations and leg swelling.  Gastrointestinal: Denies nausea, vomiting, abdominal  pain, diarrhea, constipation, blood in stool and abdominal distention.  Genitourinary: Denies dysuria, urgency, frequency, hematuria, flank pain and difficulty urinating.  Endocrine: Denies: hot or cold intolerance, sweats, changes in hair or nails, polyuria, polydipsia. Musculoskeletal: Denies myalgias, back pain, joint swelling, arthralgias and gait problem.  Skin: Denies pallor, rash and wound.  Neurological: Denies dizziness, seizures, syncope, weakness, light-headedness, numbness and headaches.  Hematological: Denies adenopathy. Easy bruising, personal or family bleeding history  Psychiatric/Behavioral: Denies suicidal ideation, mood changes, confusion, nervousness, sleep disturbance and agitation    Physical Exam: Vitals:   03/11/19 1013  BP: 124/68  Pulse: (!) 105  Temp: 97.6 F (36.4 C)  TempSrc: Temporal  SpO2: 98%  Weight: 192 lb 11.2 oz (87.4 kg)    Body mass index is 35.25 kg/m.   Constitutional: NAD, calm, comfortable, obese Eyes: PERRL, lids and conjunctivae normal ENMT: Mucous membranes are moist.  Tympanic membrane is pearly white, no erythema or bulging. Neck: normal, supple, no masses, no  thyromegaly Respiratory: clear to auscultation bilaterally, no wheezing, no crackles. Normal respiratory effort. No accessory muscle use.  Cardiovascular: Regular rate and rhythm, no murmurs / rubs / gallops. No extremity edema. 2+ pedal pulses. No carotid bruits.  Abdomen: no tenderness, no masses palpated. No hepatosplenomegaly. Bowel sounds positive.  Musculoskeletal: no clubbing / cyanosis. No joint deformity upper and lower extremities. Good ROM, no contractures. Normal muscle tone.  Skin: no rashes, lesions, ulcers. No induration Neurologic: CN 2-12 grossly intact. Sensation intact, DTR normal. Strength 5/5 in all 4.  Psychiatric: Normal judgment and insight. Alert and oriented x 3. Normal mood.    Subsequent Medicare wellness visit   1. Risk factors, based on past   M,S,F -cardiovascular disease risk factors include age and history of hyperlipidemia and obesity   2.  Physical activities: Not very physically active   3.  Depression/mood:  Mood is stable not depressed   4.  Hearing:  Has started noticing some issues with her hearing   5.  ADL's: Independent in all ADLs   6.  Fall risk:  Low fall risk   7.  Home safety: No problems identified   8.  Height weight, and visual acuity: Height and weight as above, visual acuity is 20/25 with each eye independently and together   9.  Counseling:  We have discussed healthy lifestyle today in detail, necessity of cancer screening   10. Lab orders based on risk factors: Laboratory update will be reviewed   11. Referral :  Audiology and nutritionist per patient request   12. Care plan:  Follow-up with me in 6 months   13. Cognitive assessment:  No cognitive impairment   14. Screening: Patient provided with a written and personalized 5-10 year screening schedule in the AVS.   yes   15. Provider List Update:   PCP, ophthalmologist  16. Advance Directives: Full code     Office Visit from 03/11/2019 in [facility]  PHQ-9 Total Score  1      Fall Risk  03/11/2019 02/10/2018 02/06/2017 10/29/2016 06/05/2016  Falls in the past year? 0 0 No No No  Number falls in past yr: 0 - - - -     Impression and Plan:  Encounter for preventive health examination -She has routine eye and dental care. -Immunizations are up-to-date with the exception of shingles which she has elected to hold off on. -Screening labs today. -Healthy lifestyle has been discussed in detail. -She had a colonoscopy in 2018 and she has elected not to pursue any further due to her age. -She had a normal mammogram in August 2020, she will continue to do yearly mammograms. -She does have a GYN and is slated to see him next year.  BMI 34.0-34.9,adult  - Plan: Amb ref to Medical Nutrition Therapy -Discussed healthy  lifestyle, including increased physical activity and better food choices to promote weight loss.  Osteopenia, unspecified location -Per DEXA in 2019, optimize calcium vitamin D pending on results of blood work today. -Redo DEXA in 2021.  Vitamin D deficiency  - Plan: VITAMIN D 25 Hydroxy (Vit-D Deficiency, Fractures)  Hyperlipidemia, unspecified hyperlipidemia type  -Check lipids today not currently on medication.  Bilateral hearing loss, unspecified hearing loss type  - Plan: Ambulatory referral to Audiology     Lelon Frohlich, MD [facility]

## 2019-03-16 ENCOUNTER — Other Ambulatory Visit: Payer: Self-pay

## 2019-03-17 ENCOUNTER — Ambulatory Visit (INDEPENDENT_AMBULATORY_CARE_PROVIDER_SITE_OTHER): Payer: Medicare Other

## 2019-03-17 DIAGNOSIS — E538 Deficiency of other specified B group vitamins: Secondary | ICD-10-CM | POA: Diagnosis not present

## 2019-03-17 MED ORDER — CYANOCOBALAMIN 1000 MCG/ML IJ SOLN
1000.0000 ug | Freq: Once | INTRAMUSCULAR | Status: AC
  Administered 2019-03-17: 12:00:00 1000 ug via INTRAMUSCULAR

## 2019-03-17 NOTE — Progress Notes (Signed)
Per orders of Dr. Jerilee Hoh, 1st  injection of B12  given by Franco Collet. Patient tolerated injection well.

## 2019-03-17 NOTE — Patient Instructions (Signed)
There are no preventive care reminders to display for this patient.  Depression screen [facility] 2/9 03/11/2019 02/10/2018 02/06/2017  Decreased Interest 0 0 0  Down, Depressed, Hopeless 0 0 0  PHQ - 2 Score 0 0 0  Altered sleeping 1 - -  Tired, decreased energy 0 - -  Change in appetite 0 - -  Feeling bad or failure about yourself  0 - -  Trouble concentrating 0 - -  Moving slowly or fidgety/restless 0 - -  Suicidal thoughts 0 - -  PHQ-9 Score 1 - -  Difficult doing work/chores Not difficult at all - -

## 2019-03-23 ENCOUNTER — Other Ambulatory Visit: Payer: Self-pay

## 2019-03-24 ENCOUNTER — Ambulatory Visit (INDEPENDENT_AMBULATORY_CARE_PROVIDER_SITE_OTHER): Payer: Medicare Other | Admitting: *Deleted

## 2019-03-24 DIAGNOSIS — E538 Deficiency of other specified B group vitamins: Secondary | ICD-10-CM | POA: Diagnosis not present

## 2019-03-24 MED ORDER — CYANOCOBALAMIN 1000 MCG/ML IJ SOLN
1000.0000 ug | Freq: Once | INTRAMUSCULAR | Status: AC
  Administered 2019-03-24: 1000 ug via INTRAMUSCULAR

## 2019-03-24 NOTE — Progress Notes (Signed)
Per orders of Dr. Hernandez, injection of Vitamin B 12 given by Aailyah Dunbar S Siri Buege. Patient tolerated injection well. 

## 2019-03-29 ENCOUNTER — Telehealth: Payer: Self-pay

## 2019-03-29 NOTE — Telephone Encounter (Signed)
Copied from [id number]. Topic: General - Other >> Mar 29, 2019  1:59 PM Celene Kras wrote: Reason for CRM: Pt called stating she is not going to be able to do her b12 shot due to her having shingles. Pt is requesting a callback to discuss options. Please advise.

## 2019-03-29 NOTE — Telephone Encounter (Signed)
Spoke to pt and she stated that she just wanted to know what she should do in the meantime about B12.

## 2019-03-30 NOTE — Telephone Encounter (Signed)
No issues with continuing B12.  [place]

## 2019-03-30 NOTE — Telephone Encounter (Signed)
Okay to continue B12?

## 2019-03-30 NOTE — Telephone Encounter (Signed)
Spoke with patient and gave her Dr Ledell Noss recommendation.  Patient does not want to continue B12 until she is feeling better.

## 2019-03-31 ENCOUNTER — Ambulatory Visit: Payer: Medicare Other

## 2019-04-07 ENCOUNTER — Ambulatory Visit: Payer: Medicare Other

## 2019-05-14 ENCOUNTER — Ambulatory Visit: Payer: Medicare Other | Attending: Internal Medicine

## 2019-05-14 DIAGNOSIS — Z23 Encounter for immunization: Secondary | ICD-10-CM | POA: Insufficient documentation

## 2019-05-14 NOTE — Progress Notes (Signed)
   Covid-19 Vaccination Clinic  Name:  DERIANA VANDERHOEF    MRN: [id number] DOB: 1940/05/07  05/14/2019  Ms. Brandenburger was observed post Covid-19 immunization for 15 minutes without incidence. She was provided with Vaccine Information Sheet and instruction to access the V-Safe system.   Ms. Buley was instructed to call 911 with any severe reactions post vaccine: Marland Kitchen Difficulty breathing  . Swelling of your face and throat  . A fast heartbeat  . A bad rash all over your body  . Dizziness and weakness    Immunizations Administered    Name Date Dose VIS Date Route   Pfizer COVID-19 Vaccine 05/14/2019 10:55 AM 0.3 mL 03/12/2019 Intramuscular   Manufacturer: [employer], [employer]   Lot: [id number]   NDC: [address]-8

## 2019-05-20 ENCOUNTER — Ambulatory Visit: Payer: Self-pay | Admitting: Dietician

## 2019-05-29 ENCOUNTER — Other Ambulatory Visit: Payer: Self-pay | Admitting: Internal Medicine

## 2019-05-29 DIAGNOSIS — E559 Vitamin D deficiency, unspecified: Secondary | ICD-10-CM

## 2019-05-31 ENCOUNTER — Ambulatory Visit: Payer: Medicare Other | Admitting: Dietician

## 2019-06-01 ENCOUNTER — Other Ambulatory Visit: Payer: Self-pay | Admitting: Family

## 2019-06-01 DIAGNOSIS — E559 Vitamin D deficiency, unspecified: Secondary | ICD-10-CM

## 2019-06-05 ENCOUNTER — Ambulatory Visit: Payer: Medicare Other | Attending: Internal Medicine

## 2019-06-05 DIAGNOSIS — Z23 Encounter for immunization: Secondary | ICD-10-CM | POA: Insufficient documentation

## 2019-06-05 NOTE — Progress Notes (Signed)
   Covid-19 Vaccination Clinic  Name:  Monica Lamb    MRN: [id number] DOB: 29-Apr-1940  06/05/2019  Monica Lamb was observed post Covid-19 immunization for 15 minutes without incident. She was provided with Vaccine Information Sheet and instruction to access the V-Safe system.   Monica Lamb was instructed to call 911 with any severe reactions post vaccine: Marland Kitchen Difficulty breathing  . Swelling of face and throat  . A fast heartbeat  . A bad rash all over body  . Dizziness and weakness   Immunizations Administered    Name Date Dose VIS Date Route   Pfizer COVID-19 Vaccine 06/05/2019  3:08 PM 0.3 mL 03/12/2019 Intramuscular   Manufacturer: [employer], [employer]   Lot: [id number]   NDC: [address]-6

## 2019-06-17 ENCOUNTER — Telehealth: Payer: Self-pay | Admitting: Internal Medicine

## 2019-06-17 DIAGNOSIS — E538 Deficiency of other specified B group vitamins: Secondary | ICD-10-CM

## 2019-06-17 NOTE — Telephone Encounter (Signed)
Spoke with patient and she will need a lab appointment for Vit D.  Patient also wants a B12 level.  Patient states she did not get to complete all of her B12 injections and she would like to know where her level is before she begins them.  I explained that her insurance may not cover the B12 test.  Patient insists that a B 12 is done.  Lab appointment and orders placed.

## 2019-06-17 NOTE — Telephone Encounter (Signed)
Pt received a call from the pharmacy stating that her Vitamin D #2 5000 units tablet once a week could not be refilled. Pt is not sure if she needs to continue taking the Vitamin D or if she needs an appt to be seen before continuing taking the Vitamin D? Thanks   [facility] [employer])

## 2019-06-21 ENCOUNTER — Other Ambulatory Visit: Payer: Self-pay

## 2019-06-21 ENCOUNTER — Encounter: Payer: Medicare Other | Attending: Internal Medicine | Admitting: Registered"

## 2019-06-21 DIAGNOSIS — M8580 Other specified disorders of bone density and structure, unspecified site: Secondary | ICD-10-CM | POA: Insufficient documentation

## 2019-06-21 DIAGNOSIS — Z713 Dietary counseling and surveillance: Secondary | ICD-10-CM | POA: Diagnosis present

## 2019-06-21 DIAGNOSIS — E669 Obesity, unspecified: Secondary | ICD-10-CM | POA: Diagnosis not present

## 2019-06-21 DIAGNOSIS — R7989 Other specified abnormal findings of blood chemistry: Secondary | ICD-10-CM | POA: Insufficient documentation

## 2019-06-21 NOTE — Patient Instructions (Addendum)
Instructions/Goals:  Continue with regular eating pattern of 3-5 meals/snacks OR eating every 3-5 hours while awake. Try to have balanced meals like the My Plate example (see handout). Include lean proteins, vegetables, fruits, and whole grains at meals. *Ensure to have good protein source with at least 3 out of 4 meals/snacks.   -Add some vegetables with lunch/afternoon meal as well as dinner.   -Try to include whole grains at least half of the time. Whole grains provide good sources of prebiotics that promote healthy GI.   -Recommend limiting coffee to 2 cups daily as able  -Include good sources of calcium in diet. Recommend 3 servings of dairy daily. Recommend including those with probiotics when able such as: Kefir and yogurt. See handout.   -Recipe Resource: [employer]   -Make physical activity a part of your week. Try to include at least 30 minutes of physical activity 5 days each week or at least 150 minutes per week. Regular physical activity promotes overall health-including helping to reduce risk for heart disease and diabetes, promoting mental health, and helping [place] sleep better.

## 2019-06-21 NOTE — Progress Notes (Signed)
Medical Nutrition Therapy:  Appt start time: 1127 end time:  1235.  Assessment:  Primary concerns today: Pt referred due to dx low vitamin D, osteopenia, weight management, and C. Difficile colitis.   Pt reports she asked for referral due to pt having history of C diff that lasted 1 year from June 2017-May of 2018. Pt reports she lost 45 lb that year. Pt received a fecal transplant as treatment. Reports having C diff resulted in pt reducing the variety of foods she eats. Since that time, pt reports having history of low vitamin D and low B12 which she is having rechecked soon to see if supplementation is still/is needed. Reports she felt she needed more nutrition information to ensure she is getting what she needs considering this health history.   Pt does not currently take a calcium supplement. Used to use Tums for supplement, but was told not to due to past with C diff. Reports having some dairy in diet. Pt does consume probiotics in diet-yogurt and Kefir weekly.   Reports avoiding mushrooms since having c diff and pt is unsure why. Reports questions about sour cream (was told not ok with c diff). Main veggies she includes: broccoli, tomatoes, green peppers, spinach)  Does not prepare fried foods at home, does buy fried foods out sometimes. Pt uses olive oil mostly. Reports using butter on bread.   Pt would like recipes for how to season quinoa.   Food Allergies/Intolerances: limits onions due to indigestion.   GI Concerns: None reported. Hx of C diff.   Pertinent Lab Values: 06/23/19:  Vitamin D: 22.80  Preferred Learning Style:   No preference indicated   Learning Readiness:   Ready  MEDICATIONS: See list.    DIETARY INTAKE:  Usual eating pattern includes 4 small meals/snacks per day. Reports when she was sick about all she did was graze. Usually will have fresh fruit and a grain (fruit and toast with peanut butter OR fruit and mini scone) and then protein 1.5 hours later such  as leftovers from dinner the night before, etc.   Common foods: fruit with a mini scone.  Avoided foods: scallops, cauliflower, mushrooms, romaine lettuce.    Typical Snacks: various fruits, nuts, crackers.     Typical Beverages: water, coffee, hot tea.   Location of Meals: N/A  Drinks Kefir weekly but not daily. Yogurt 3-4 times per week. Uses yogurt in place of sour cream   Electronics Present at Mealtimes: N/A  24-hr recall: only day she cooks breakfast is Sunday. Has fruit and toast with peanut butter or fruit and mini scone other days for breakfast. B ( AM): berries, 1/3 banana, small slice, 2 cups coffee (half decaf), may have 1 pack sugar Snk (1130 AM): 2 eggs, 1 piece oat nut toast, 3 links of sausage, 1 cup coffee Snk ( PM): crackers with cream cheese, grapes, walnuts, usually tea (green or [place] Breakfast tea), squirt of honey D ( PM): roasted chicken (wings and leg), grapes, sliced green peppers, olives, cole slaw, slice of [place] bread, water Snk ( PM): None reported.  Beverages: water throughout the day; 4 cups coffee throughout the day. Typically has 2 cups coffee during the week.   Usual physical activity: walking Minutes/Week: N/A  Progress Towards Goal(s):  In progress.   Nutritional Diagnosis:  NB-1.1 Food and nutrition-related knowledge deficit As related to no prior nutrition education from dietitian.  As evidenced by pt reports requesting referral due to questioins about adequacy of diet.  Intervention:  Nutrition counseling provided. Provided education regarding balanced nutrition. Praised areas pt is doing very well-overall balanced intake, consistent meal pattern. Discussed adding more dairy and preferably sources with probiotics such as the Kefir and yogurt with probiotics. Will look into calcium supplements with pt's hx of C diff and let pt know about whether supplementation is recommended or not as pt had hx of osteopenia. Discussed ensuring good protein  source is included in 3 of 4 meals/snacks and may add vegetables to lunch more often. Recommended limiting coffee to 2 cups per day. Provided website with healthy recipes for quinoa as well as many other foods. Encouraged regular physical activity such as walking. Pt appeared agreeable to information/goals discussed.  Instructions/Goals:  Continue with regular eating pattern of 3-5 meals/snacks OR eating every 3-5 hours while awake. Try to have balanced meals like the My Plate example (see handout). Include lean proteins, vegetables, fruits, and whole grains at meals. *Ensure to have good protein source with at least 3 out of 4 meals/snacks.   -Add some vegetables with lunch/afternoon meal as well as dinner.   -Try to include whole grains at least half of the time. Whole grains provide good sources of prebiotics that promote healthy GI.   -Recommend limiting coffee to 2 cups daily as able  -Include good sources of calcium in diet. Recommend 3 servings of dairy daily. Recommend including those with probiotics when able such as: Kefir and yogurt. See handout.   -Recipe Resource: [employer]   -Make physical activity a part of your week. Try to include at least 30 minutes of physical activity 5 days each week or at least 150 minutes per week. Regular physical activity promotes overall health-including helping to reduce risk for heart disease and diabetes, promoting mental health, and helping [place] sleep better.    Teaching Method Utilized:  Visual Auditory  Handouts given during visit include:  Balanced plate and food list.   List of Calcium in Foods   Barriers to learning/adherence to lifestyle change: None indicated.   Demonstrated degree of understanding via:  Teach Back   Monitoring/Evaluation:  Dietary intake, exercise, and body weight prn. Contact information provided.

## 2019-06-22 ENCOUNTER — Other Ambulatory Visit: Payer: Self-pay

## 2019-06-23 ENCOUNTER — Other Ambulatory Visit (INDEPENDENT_AMBULATORY_CARE_PROVIDER_SITE_OTHER): Payer: Medicare Other

## 2019-06-23 DIAGNOSIS — E559 Vitamin D deficiency, unspecified: Secondary | ICD-10-CM | POA: Diagnosis not present

## 2019-06-23 DIAGNOSIS — E538 Deficiency of other specified B group vitamins: Secondary | ICD-10-CM

## 2019-06-23 LAB — VITAMIN B12: Vitamin B-12: 214 pg/mL (ref 211–911)

## 2019-06-23 LAB — VITAMIN D 25 HYDROXY (VIT D DEFICIENCY, FRACTURES): VITD: 22.8 ng/mL — ABNORMAL LOW (ref 30.00–100.00)

## 2019-06-25 ENCOUNTER — Encounter: Payer: Self-pay | Admitting: Registered"

## 2019-06-30 ENCOUNTER — Other Ambulatory Visit: Payer: Self-pay

## 2019-07-01 ENCOUNTER — Ambulatory Visit (INDEPENDENT_AMBULATORY_CARE_PROVIDER_SITE_OTHER): Payer: Medicare Other

## 2019-07-01 DIAGNOSIS — E538 Deficiency of other specified B group vitamins: Secondary | ICD-10-CM

## 2019-07-01 MED ORDER — CYANOCOBALAMIN 1000 MCG/ML IJ SOLN
1000.0000 ug | Freq: Once | INTRAMUSCULAR | Status: AC
  Administered 2019-07-01: 1000 ug via INTRAMUSCULAR

## 2019-07-01 NOTE — Progress Notes (Signed)
Per orders of Dr. Peggye Pitt patient was given vitamin B12 injection in left deltoid. Patient tolerated injection well.

## 2019-07-05 ENCOUNTER — Ambulatory Visit (INDEPENDENT_AMBULATORY_CARE_PROVIDER_SITE_OTHER): Payer: Medicare Other | Admitting: Ophthalmology

## 2019-07-05 ENCOUNTER — Encounter (INDEPENDENT_AMBULATORY_CARE_PROVIDER_SITE_OTHER): Payer: Self-pay | Admitting: Ophthalmology

## 2019-07-05 ENCOUNTER — Other Ambulatory Visit: Payer: Self-pay

## 2019-07-05 DIAGNOSIS — H35372 Puckering of macula, left eye: Secondary | ICD-10-CM | POA: Insufficient documentation

## 2019-07-05 DIAGNOSIS — H353131 Nonexudative age-related macular degeneration, bilateral, early dry stage: Secondary | ICD-10-CM

## 2019-07-05 DIAGNOSIS — H43822 Vitreomacular adhesion, left eye: Secondary | ICD-10-CM | POA: Diagnosis not present

## 2019-07-05 DIAGNOSIS — Z961 Presence of intraocular lens: Secondary | ICD-10-CM | POA: Diagnosis not present

## 2019-07-05 NOTE — Assessment & Plan Note (Signed)
The nature of macular pucker was discussed with the patient as well as threshold criteria for vitrectomy surgery. I explained that in rare cases another surgery is needed to actually remove a second wrinkle should it regrow.  Most often, the epiretinal membrane and underlying wrinkled internal limiting membrane are removed, to accomplish the goals.   If the operative eye is [place], cataract surgery is often recommended prior to Vitrectomy. This will enable the surgeon to have the best view during surgery and the patient optimal results in the future. Treatment options were discussed.  MINOR OS AT THIS TIME, OBSERVE  MONITORING MONOCULARLY READING REVIEWED.

## 2019-07-05 NOTE — Assessment & Plan Note (Signed)
The nature of age realated macular degeneration (ARMD)is explained as follows: The dry form refers to the progressive loss of normal blood supply to the central vision as a result of a combination of factors which include aging blood supply (arteriosclerosis, hardening of the arteries), genetics, smoking habits, and history of hypertension. Currently, no eye medications or vitamins slow this type of aging effect upon vision, however cessation of smoking and controlling hypertension help slow the disorder. The following analogy helps explain this: I describe the dry form of ARMD like a house of the same age as your eyes, which shows typical wear and tear of age upon the house structure and appearance. Like the aging house which can fall down structurally, the dry form of ARMD can deteriorate the structure of the macula (center) of the retina, most often gradually and affect central vision tasks such as reading and driving. The wet form of ARMD refers to the development of abnormally growing blood vessels usually near or under the central vision, with potential risk of permanent visual changes or vision losses. This complication of the Dry form of ARMD may be moderately reduced by use of AREDS 2 formula multivitamins daily. I describe the Wet form of ARMD as like the development of a fire in an aging house (DRY ARMD). It may develop suddenly, progress rapidly and be destructive based on where it starts and how big the fire is when found. In the eye, the house fire analogy refers to the abnormal blood vessels growing destructively near the central vison in the retina, or film of the eye. Halting the growth of blood vessels with laser (hot or cold) or injectable medications is best way "to put the fire out". Many patients will experience a stabilization or even improvement in vision with a treatment course, while others may still face a loss of vision. The use of injectable medications has revolutionized therapy and is  currently the only proven therapy to provide the chance of stable or improved acuity from new and recent destructive wet ARMD. 

## 2019-07-06 ENCOUNTER — Other Ambulatory Visit: Payer: Self-pay | Admitting: Internal Medicine

## 2019-07-06 DIAGNOSIS — E559 Vitamin D deficiency, unspecified: Secondary | ICD-10-CM

## 2019-07-06 MED ORDER — VITAMIN D (ERGOCALCIFEROL) 1.25 MG (50000 UNIT) PO CAPS: 50000.0000 [IU] | ORAL_CAPSULE | ORAL | 0 refills | Status: AC

## 2019-08-03 ENCOUNTER — Other Ambulatory Visit: Payer: Self-pay

## 2019-08-04 ENCOUNTER — Ambulatory Visit (INDEPENDENT_AMBULATORY_CARE_PROVIDER_SITE_OTHER): Payer: Medicare Other

## 2019-08-04 DIAGNOSIS — E538 Deficiency of other specified B group vitamins: Secondary | ICD-10-CM

## 2019-08-04 MED ORDER — CYANOCOBALAMIN 1000 MCG/ML IJ SOLN
1000.0000 ug | Freq: Once | INTRAMUSCULAR | Status: AC
  Administered 2019-08-04: 1000 ug via INTRAMUSCULAR

## 2019-08-04 NOTE — Progress Notes (Signed)
Per orders of Dr.Hernandez, injection of B12 given in right deltoid by Sherrin Daisy. Patient tolerated injection well.

## 2019-08-04 NOTE — Patient Instructions (Signed)
There are no preventive care reminders to display for this patient.  Depression screen [facility] 2/9 06/25/2019 03/11/2019 02/10/2018  Decreased Interest 0 0 0  Down, Depressed, Hopeless 0 0 0  PHQ - 2 Score 0 0 0  Altered sleeping - 1 -  Tired, decreased energy - 0 -  Change in appetite - 0 -  Feeling bad or failure about yourself  - 0 -  Trouble concentrating - 0 -  Moving slowly or fidgety/restless - 0 -  Suicidal thoughts - 0 -  PHQ-9 Score - 1 -  Difficult doing work/chores - Not difficult at all -

## 2019-09-07 ENCOUNTER — Other Ambulatory Visit: Payer: Self-pay

## 2019-09-08 ENCOUNTER — Ambulatory Visit (INDEPENDENT_AMBULATORY_CARE_PROVIDER_SITE_OTHER): Payer: Medicare Other

## 2019-09-08 DIAGNOSIS — E538 Deficiency of other specified B group vitamins: Secondary | ICD-10-CM | POA: Diagnosis not present

## 2019-09-08 MED ORDER — CYANOCOBALAMIN 1000 MCG/ML IJ SOLN
1000.0000 ug | Freq: Once | INTRAMUSCULAR | Status: AC
  Administered 2019-09-08: 1000 ug via INTRAMUSCULAR

## 2019-09-08 NOTE — Patient Instructions (Signed)
Health Maintenance Due  Topic Date Due  . Hepatitis C Screening  Never done    Depression screen [facility] 2/9 06/25/2019 03/11/2019 02/10/2018  Decreased Interest 0 0 0  Down, Depressed, Hopeless 0 0 0  PHQ - 2 Score 0 0 0  Altered sleeping - 1 -  Tired, decreased energy - 0 -  Change in appetite - 0 -  Feeling bad or failure about yourself  - 0 -  Trouble concentrating - 0 -  Moving slowly or fidgety/restless - 0 -  Suicidal thoughts - 0 -  PHQ-9 Score - 1 -  Difficult doing work/chores - Not difficult at all -

## 2019-09-08 NOTE — Progress Notes (Signed)
Per orders of Dr.Hernandez , injection of B12 given in Left  deltoid by Kysen Wetherington R Guerline Happ. Patient tolerated injection well.  

## 2019-09-22 ENCOUNTER — Other Ambulatory Visit: Payer: Self-pay | Admitting: Internal Medicine

## 2019-09-22 DIAGNOSIS — E559 Vitamin D deficiency, unspecified: Secondary | ICD-10-CM

## 2019-10-08 ENCOUNTER — Telehealth: Payer: Self-pay | Admitting: Internal Medicine

## 2019-10-08 NOTE — Telephone Encounter (Signed)
Pt call and want a call back about her vit D and B-12 please call this # [phone]

## 2019-10-08 NOTE — Telephone Encounter (Signed)
Pt called back and has been scheduled for labs.

## 2019-10-08 NOTE — Telephone Encounter (Signed)
Left message on machine returning patient's call. Patient will need a lab appointment for Vit D refills

## 2019-10-12 ENCOUNTER — Other Ambulatory Visit: Payer: Self-pay

## 2019-10-12 ENCOUNTER — Other Ambulatory Visit (INDEPENDENT_AMBULATORY_CARE_PROVIDER_SITE_OTHER): Payer: Medicare Other

## 2019-10-12 DIAGNOSIS — E559 Vitamin D deficiency, unspecified: Secondary | ICD-10-CM

## 2019-10-12 NOTE — Addendum Note (Signed)
Addended by: Lerry Liner on: 10/12/2019 11:49 AM   Modules accepted: Orders

## 2019-10-13 ENCOUNTER — Other Ambulatory Visit: Payer: Self-pay | Admitting: Internal Medicine

## 2019-10-13 DIAGNOSIS — E559 Vitamin D deficiency, unspecified: Secondary | ICD-10-CM

## 2019-10-13 LAB — VITAMIN D 25 HYDROXY (VIT D DEFICIENCY, FRACTURES): Vit D, 25-Hydroxy: 31 ng/mL (ref 30–100)

## 2019-10-13 MED ORDER — VITAMIN D (ERGOCALCIFEROL) 1.25 MG (50000 UNIT) PO CAPS: 50000.0000 [IU] | ORAL_CAPSULE | ORAL | 0 refills | Status: AC

## 2019-10-20 ENCOUNTER — Telehealth: Payer: Self-pay | Admitting: Internal Medicine

## 2019-10-20 ENCOUNTER — Other Ambulatory Visit: Payer: Self-pay | Admitting: Internal Medicine

## 2019-10-20 DIAGNOSIS — E559 Vitamin D deficiency, unspecified: Secondary | ICD-10-CM

## 2019-10-20 NOTE — Telephone Encounter (Signed)
Patient is aware See lab result note 

## 2019-10-20 NOTE — Telephone Encounter (Signed)
Pt is returning the call to get her lab results °

## 2019-12-23 ENCOUNTER — Telehealth: Payer: Self-pay | Admitting: Internal Medicine

## 2019-12-23 DIAGNOSIS — E538 Deficiency of other specified B group vitamins: Secondary | ICD-10-CM

## 2019-12-23 NOTE — Addendum Note (Signed)
Addended by: Kern Reap B on: 12/23/2019 04:41 PM   Modules accepted: Orders

## 2019-12-23 NOTE — Telephone Encounter (Signed)
I am ok with flu vaccine here or at a local pharmacy. COVID boosters are not yet approved, altho we expect them to be soon. Updates coming. I would advise continuing b12 injections monthly. If she refuses, then B12 1000 mcg daily OTC and recheck B12 in 6 months.

## 2019-12-23 NOTE — Telephone Encounter (Signed)
Patient called wanting to ask some questions to [facility] nurse.  1. She needs a flu shot in the next week or two because she is traveling international at the end of October.  2. Should she get the COVID booster before she travels  3. She was taking Vitamin B12 shots and now she's not and she was wondering if she should be talking Vitamin B supplements and what dosage.  Please advise

## 2019-12-23 NOTE — Telephone Encounter (Signed)
Patient is aware and will call back to schedule a flu vaccine. Patient will try OTC B12.  Lab appointment and order placed.

## 2019-12-27 ENCOUNTER — Other Ambulatory Visit: Payer: Self-pay | Admitting: Obstetrics & Gynecology

## 2019-12-27 DIAGNOSIS — Z1231 Encounter for screening mammogram for malignant neoplasm of breast: Secondary | ICD-10-CM

## 2020-01-12 ENCOUNTER — Ambulatory Visit: Payer: Medicare Other

## 2020-02-28 ENCOUNTER — Ambulatory Visit
Admission: RE | Admit: 2020-02-28 | Discharge: 2020-02-28 | Disposition: A | Discharge status: Home / Self Care | Payer: Medicare Other | Source: Ambulatory Visit | Attending: Obstetrics & Gynecology | Admitting: Obstetrics & Gynecology

## 2020-02-28 ENCOUNTER — Other Ambulatory Visit: Payer: Self-pay

## 2020-02-28 DIAGNOSIS — Z1231 Encounter for screening mammogram for malignant neoplasm of breast: Secondary | ICD-10-CM

## 2020-05-12 NOTE — Addendum Note (Signed)
Addended by: Lerry Liner on: 05/12/2020 03:07 PM   Modules accepted: Orders

## 2020-06-19 ENCOUNTER — Other Ambulatory Visit (INDEPENDENT_AMBULATORY_CARE_PROVIDER_SITE_OTHER): Payer: Medicare Other

## 2020-06-19 ENCOUNTER — Other Ambulatory Visit: Payer: Self-pay

## 2020-06-19 DIAGNOSIS — E538 Deficiency of other specified B group vitamins: Secondary | ICD-10-CM | POA: Diagnosis not present

## 2020-06-19 LAB — VITAMIN B12: Vitamin B-12: 1016 pg/mL — ABNORMAL HIGH (ref 211–911)

## 2020-07-04 ENCOUNTER — Encounter (INDEPENDENT_AMBULATORY_CARE_PROVIDER_SITE_OTHER): Payer: Self-pay | Admitting: Ophthalmology

## 2020-07-04 ENCOUNTER — Ambulatory Visit (INDEPENDENT_AMBULATORY_CARE_PROVIDER_SITE_OTHER): Payer: Medicare Other | Admitting: Ophthalmology

## 2020-07-04 ENCOUNTER — Other Ambulatory Visit: Payer: Self-pay

## 2020-07-04 DIAGNOSIS — H35041 Retinal micro-aneurysms, unspecified, right eye: Secondary | ICD-10-CM | POA: Diagnosis not present

## 2020-07-04 DIAGNOSIS — H35372 Puckering of macula, left eye: Secondary | ICD-10-CM | POA: Diagnosis not present

## 2020-07-04 DIAGNOSIS — H43822 Vitreomacular adhesion, left eye: Secondary | ICD-10-CM

## 2020-07-04 DIAGNOSIS — H353131 Nonexudative age-related macular degeneration, bilateral, early dry stage: Secondary | ICD-10-CM

## 2020-07-04 NOTE — Progress Notes (Signed)
07/04/2020     CHIEF COMPLAINT Patient presents for Retina Follow Up (1 Year f\u OU. OCT/Pt states vision is about the same. Denies new floaters and FOL. Pt occasionally uses [facility] OU)   HISTORY OF PRESENT ILLNESS: Monica Lamb is a 80 y.o. 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  Duration of 1 year.  Since onset it is stable.  I, the attending physician,  performed the HPI with the patient and updated documentation appropriately. Additional comments: 1 Year f\u OU. OCT Pt states vision is about the same. Denies new floaters and FOL. Pt occasionally uses [facility] OU       Last edited by Elyse Jarvis on 07/04/2020  1:04 PM. (History)      Referring physician: Philip Aspen, Limmie Patricia, MD [address] [place],  [place] [address]  HISTORICAL INFORMATION:   Selected notes from the MEDICAL RECORD NUMBER    Lab Results  Component Value Date   HGBA1C 5.7 02/06/2016     CURRENT MEDICATIONS: Current Outpatient Medications (Ophthalmic Drugs)  Medication Sig  . ketotifen (ZADITOR) 0.025 % ophthalmic solution Place 1 drop into both eyes 2 (two) times daily.  Marland Kitchen moxifloxacin (VIGAMOX) 0.5 % ophthalmic solution Place 1 drop into the right eye 4 (four) times daily.  Bertram Gala Glycol-Propyl Glycol (SYSTANE OP) Apply 1 drop to eye daily as needed (DRY EYES).  Marland Kitchen tobramycin-dexamethasone (TOBRADEX) ophthalmic solution INSTILL 1 DROP INTO RIGHT EYE 4X DAILY FOR 5 DAYS THEN TWICE DAILY X 5 DAYS THEN STOP   No current facility-administered medications for this visit. (Ophthalmic Drugs)   Current Outpatient Medications (Other)  Medication Sig  . acetaminophen (TYLENOL) 500 MG tablet Take 500 mg by mouth every 6 (six) hours as needed for mild pain or headache.  . cholecalciferol (VITAMIN D) 1000 units tablet Take 1,000 Units by mouth daily.  . metroNIDAZOLE (METROCREAM) 0.75 % cream Apply 1 application  topically daily. For rosacea  . Multiple Vitamins-Minerals (ICAPS AREDS 2 PO) Take by mouth 2 (two) times daily.  Marland Kitchen Phenylephrine-Acetaminophen (TYLENOL SINUS+HEADACHE PO) Take 1 tablet by mouth daily as needed (SINUS HEADACHE).   No current facility-administered medications for this visit. (Other)      REVIEW OF SYSTEMS:    ALLERGIES Allergies  Allergen Reactions  . Clindamycin/Lincomycin     C-diff per patient  . 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 Penicillins Rash    Has patient had a PCN reaction causing immediate rash, facial/tongue/throat swelling, SOB or lightheadedness with hypotension: No Has patient had a PCN reaction causing severe rash involving mucus membranes or skin necrosis: No Has patient had a PCN reaction that required hospitalization No Has patient had a PCN reaction occurring within the last 10 years: No If all of the above answers are "NO", then may proceed with Cephalosporin use.     PAST MEDICAL HISTORY Past Medical History:  Diagnosis Date  . Allergy   . Atherosclerosis of aorta (HCC) 02/18/2018  . C. difficile diarrhea   . Cataract    removed x 2   . Dry eye syndrome of both eyes   . Glaucoma   . Osteopenia    took fosomax remotely for a few  years- past hx-  . 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 Rosacea    -had surgery with Dr. Roda Shutters remortely; sees Dr. Dagoberto Ligas  . Solitary pulmonary nodule 02/06/2016   -solitary pulm nodule, incidental finding on abd CT in 2016 -she discussed with ID and me and was indecisive about whether or not to do a repeat 12 month CT scan, she ultimately decided to hold off on the order, but to call us if she changed her  mind  -completed follow up per radiology recommendations in 2019   Past Surgical History:  Procedure Laterality Date  . CATARACT EXTRACTION Bilateral 2016   Dr. Dagoberto Ligas  . CATARACT EXTRACTION, BILATERAL    . COLONOSCOPY N/A 08/06/2016   Procedure: COLONOSCOPY;  Surgeon: Iva Boop, MD;  Location: [facility];  Service: Endoscopy;  Laterality: N/A;  . EYE SURGERY Bilateral 2013   Tear duct sx for closed angle glaucoma  . 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 LYMPH NODE BIOPSY     benign per patient  . lymphnode biopsy    . TUBAL LIGATION      FAMILY HISTORY Family History  Problem Relation Age of Onset  . Arthritis Mother   . Hypertension Mother   . Macular degeneration Mother   . Cancer Father   . Lung cancer Father   . Arthritis Sister   . Hypertension Sister   . Cancer Maternal Aunt   . Breast cancer Maternal Aunt   . Lung cancer Sister   . Colon cancer Neg Hx   . Colon polyps Neg Hx   . Esophageal cancer Neg Hx   . Stomach cancer Neg Hx   . Rectal cancer Neg Hx     SOCIAL HISTORY Social History   Tobacco Use  . Smoking status: Never Smoker  . Smokeless tobacco: Never Used  . Tobacco comment: was around 2ndary smoke  Substance Use Topics  . Alcohol use: No  . Drug use: No         OPHTHALMIC EXAM: Base Eye Exam    Visual Acuity (Snellen - Linear)      Right Left   Dist [place] 20/20 20/25 -2       Tonometry (Tonopen, 1:08 PM)      Right Left   Pressure 17 20       Pupils      Pupils Dark Light Shape React APD   Right PERRL 3 2 Round Brisk None   Left PERRL 3 2 Round Brisk None       Visual Fields (Counting fingers)      Left Right    Full Full       Neuro/Psych    Oriented x3: Yes   Mood/Affect: Normal       Dilation    Both eyes: 1.0% Mydriacyl, 2.5% Phenylephrine @ 1:08 PM        Slit Lamp and Fundus Exam     External Exam      Right Left   External Normal Normal       Slit Lamp Exam      Right Left   Lids/Lashes Normal Normal   Conjunctiva/Sclera White and quiet White and quiet   Cornea Clear Clear   Anterior Chamber Deep and quiet Deep and quiet   Iris Round and reactive Round and reactive  Lens Posterior chamber intraocular lens, PC clear Posterior chamber intraocular lens, PC clear   Anterior Vitreous Normal Normal       Fundus Exam      Right Left   Posterior Vitreous Normal Normal   Disc Normal Normal   C/D Ratio 0.3 0.3   Macula Age related macular degeneration, Early age related macular degeneration, Drusen, Hard drusen, no macular thickening, no hemorrhage, no thickening, no CME Drusen, Hard drusen, Atrophy, Retinal pigment epithelial atrophy, Age related macular degeneration, Early age related macular degeneration, no macular thickening   Vessels Normal, small cluster of 2 or 3 microaneurysms temporal aspect of the macula not perifoveal near small drusen Normal   Periphery Microaneurysms Normal          IMAGING AND PROCEDURES  Imaging and Procedures for 07/04/20  OCT, Retina - OU - Both Eyes       Right Eye Quality was good. Scan locations included subfoveal. Central Foveal Thickness: 267. Progression has been stable. Findings include retinal drusen , vitreomacular adhesion .   Left Eye Quality was good. Scan locations included subfoveal. Central Foveal Thickness: 272. Progression has been stable. Findings include epiretinal membrane.   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 Checking an Amsler Grid daily with advice to return immediately should a distortion develop, was given to the patient. The patient 's smoking status now and in the past was determined and advice based on the AREDS study was provided regarding the consumption of antioxidant supplements. AREDS 2 vitamin formulation was recommended. Consumption of dark leafy vegetables and fresh fruits of various colors was recommended. Treatment modalities for wet macular degeneration particularly the use of intravitreal injections of anti-blood vessel growth factors was discussed with the patient. Avastin, Lucentis, and Eylea are the available options. On occasion, therapy includes the use of photodynamic therapy and thermal laser. Stressed to the patient do not rub eyes.  Patient was advised to check Amsler Grid daily and return immediately if changes are noted. Instructions on using the grid were given to the patient. All patient questions were answered.  Early ARMD with small drusen, did not rise to the level of intermediate  Thus optional use of eye vitamins is appropriate      ICD-10-CM   1. Left epiretinal membrane  H35.372 OCT, Retina - OU - Both Eyes  2. Retinal microaneurysms, right  H35.041   3. Vitreomacular adhesion of left eye  H43.822   4. Early stage nonexudative age-related macular degeneration of both eyes  H35.3131     1.  ARMD OU, mild, no progression  2.  New finding right eye temporal portion of the macula, not perifoveal, retinal microaneurysm cluster, not associated  with hemorrhage cannot associate with  thickening on exertion associate with exudative process.  We will thus observe  3.  Ophthalmic Meds Ordered this visit:  No orders of the defined types were placed in this encounter.      Return in about 6 months (around 01/03/2021) for DILATE OU, COLOR FP, OCT.  There are no Patient Instructions on file for this visit.   Explained the diagnoses, plan, and follow up with the patient and they expressed understanding.  Patient expressed understanding of the importance of proper follow up care.   Alford Highland Ross Bender M.D. Diseases & Surgery of the Retina and Vitreous [facility] 07/04/20     Abbreviations: M myopia (nearsighted); A astigmatism; H hyperopia (farsighted); P presbyopia; Mrx spectacle prescription;  CTL contact lenses; OD right eye; OS left eye; OU both eyes  XT exotropia; ET esotropia; PEK punctate epithelial keratitis; PEE punctate epithelial erosions; DES dry eye syndrome; MGD meibomian gland dysfunction; ATs artificial tears; PFAT's preservative free artificial tears; NSC nuclear sclerotic cataract; PSC posterior subcapsular cataract; ERM epi-retinal membrane; PVD posterior vitreous detachment; RD retinal detachment; DM diabetes mellitus; DR diabetic retinopathy; NPDR non-proliferative diabetic retinopathy; PDR proliferative diabetic retinopathy; CSME clinically significant macular edema; DME diabetic macular edema; dbh dot blot hemorrhages; CWS cotton wool spot; POAG primary open angle glaucoma; C/D cup-to-disc ratio; HVF humphrey visual field; GVF goldmann visual field; OCT optical coherence tomography; IOP intraocular pressure; BRVO Branch retinal vein occlusion; CRVO central retinal vein occlusion; CRAO central retinal artery occlusion; BRAO branch retinal artery occlusion; RT retinal tear; SB scleral buckle; PPV pars plana vitrectomy; VH Vitreous hemorrhage; PRP panretinal laser photocoagulation; IVK intravitreal kenalog; VMT vitreomacular traction; MH Macular  hole;  NVD neovascularization of the disc; NVE neovascularization elsewhere; AREDS age related eye disease study; ARMD age related macular degeneration; POAG primary open angle glaucoma; EBMD epithelial/anterior basement membrane dystrophy; ACIOL anterior chamber intraocular lens; IOL intraocular lens; PCIOL posterior chamber intraocular lens; Phaco/IOL phacoemulsification with intraocular lens placement; PRK photorefractive keratectomy; LASIK laser assisted in situ keratomileusis; HTN hypertension; DM diabetes mellitus; COPD chronic obstructive pulmonary disease

## 2020-07-04 NOTE — Assessment & Plan Note (Signed)
The nature of age--related macular degeneration was discussed with the patient as well as the distinction between dry and wet types. Checking an Amsler Grid daily with advice to return immediately should a distortion develop, was given to the patient. The patient 's smoking status now and in the past was determined and advice based on the AREDS study was provided regarding the consumption of antioxidant supplements. AREDS 2 vitamin formulation was recommended. Consumption of dark leafy vegetables and fresh fruits of various colors was recommended. Treatment modalities for wet macular degeneration particularly the use of intravitreal injections of anti-blood vessel growth factors was discussed with the patient. Avastin, Lucentis, and Eylea are the available options. On occasion, therapy includes the use of photodynamic therapy and thermal laser. Stressed to the patient do not rub eyes.  Patient was advised to check Amsler Grid daily and return immediately if changes are noted. Instructions on using the grid were given to the patient. All patient questions were answered.  Early ARMD with small drusen, did not rise to the level of intermediate  Thus optional use of eye vitamins is appropriate

## 2020-07-04 NOTE — Assessment & Plan Note (Signed)
Found on this date, temporal to the macula but not perifoveal thus not likely to be macular telangiectasis, with no leakage no thickening no CME by OCT thus these are not pathologic at this time.  Patient does not have  diabetes mellitus diagnosis

## 2020-07-04 NOTE — Assessment & Plan Note (Signed)
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

## 2020-07-04 NOTE — Assessment & Plan Note (Signed)
Minor none distorting to the fovea continue to observe

## 2021-01-16 ENCOUNTER — Other Ambulatory Visit: Payer: Self-pay | Admitting: Internal Medicine

## 2021-01-16 DIAGNOSIS — Z1231 Encounter for screening mammogram for malignant neoplasm of breast: Secondary | ICD-10-CM

## 2021-01-17 ENCOUNTER — Encounter (INDEPENDENT_AMBULATORY_CARE_PROVIDER_SITE_OTHER): Payer: Medicare Other | Admitting: Ophthalmology

## 2021-01-23 ENCOUNTER — Other Ambulatory Visit: Payer: Self-pay

## 2021-01-23 ENCOUNTER — Ambulatory Visit (INDEPENDENT_AMBULATORY_CARE_PROVIDER_SITE_OTHER): Payer: Medicare Other | Admitting: Ophthalmology

## 2021-01-23 ENCOUNTER — Encounter (INDEPENDENT_AMBULATORY_CARE_PROVIDER_SITE_OTHER): Payer: Self-pay | Admitting: Ophthalmology

## 2021-01-23 DIAGNOSIS — H35372 Puckering of macula, left eye: Secondary | ICD-10-CM

## 2021-01-23 DIAGNOSIS — H35041 Retinal micro-aneurysms, unspecified, right eye: Secondary | ICD-10-CM

## 2021-01-23 DIAGNOSIS — H43822 Vitreomacular adhesion, left eye: Secondary | ICD-10-CM

## 2021-01-23 DIAGNOSIS — H353131 Nonexudative age-related macular degeneration, bilateral, early dry stage: Secondary | ICD-10-CM | POA: Diagnosis not present

## 2021-01-23 NOTE — Assessment & Plan Note (Signed)
No intraretinal changes secondary to the physiologic vitreomacular fusion

## 2021-01-23 NOTE — Progress Notes (Signed)
01/23/2021     CHIEF COMPLAINT Patient presents for  Chief Complaint  Patient presents with   Retina Follow Up      HISTORY OF PRESENT ILLNESS: Monica Lamb is a 80 y.o. female who presents to the clinic today for:   HPI     Retina Follow Up   Patient presents with  Other.  In both eyes.  This started 6 months ago.  Severity is mild.  Duration of 6 months.  Since onset it is stable.        Comments   6 month fu OU and OCT FP Pt states VA OU stable since last visit. Pt denies FOL, floaters, or ocular pain OU.  Pt reports that she is only using OTC eye drops at this time        Last edited by Edmon Crape, MD on 01/23/2021  3:56 PM.      Referring physician: Maris Berger, MD [address] [place],  [place] [address]  HISTORICAL INFORMATION:   Selected notes from the MEDICAL RECORD NUMBER    Lab Results  Component Value Date   HGBA1C 5.7 02/06/2016     CURRENT MEDICATIONS: Current Outpatient Medications (Ophthalmic Drugs)  Medication Sig   ketotifen (ZADITOR) 0.025 % ophthalmic solution Place 1 drop into both eyes 2 (two) times daily.   moxifloxacin (VIGAMOX) 0.5 % ophthalmic solution Place 1 drop into the right eye 4 (four) times daily.   Polyethyl Glycol-Propyl Glycol (SYSTANE OP) Apply 1 drop to eye daily as needed (DRY EYES).   tobramycin-dexamethasone (TOBRADEX) ophthalmic solution INSTILL 1 DROP INTO RIGHT EYE 4X DAILY FOR 5 DAYS THEN TWICE DAILY X 5 DAYS THEN STOP   No current facility-administered medications for this visit. (Ophthalmic Drugs)   Current Outpatient Medications (Other)  Medication Sig   acetaminophen (TYLENOL) 500 MG tablet Take 500 mg by mouth every 6 (six) hours as needed for mild pain or headache.   cholecalciferol (VITAMIN D) 1000 units tablet Take 1,000 Units by mouth daily.   metroNIDAZOLE (METROCREAM) 0.75 % cream Apply 1 application topically daily. For rosacea   Multiple Vitamins-Minerals (ICAPS AREDS 2 PO)  Take by mouth 2 (two) times daily.   Phenylephrine-Acetaminophen (TYLENOL SINUS+HEADACHE PO) Take 1 tablet by mouth daily as needed (SINUS HEADACHE).   No current facility-administered medications for this visit. 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 Low risk: Metronidazole, aminoglycosides, vancomycin, chloramphenical Medium risk; tetracyclines, sulfonamides, macrolides and teicoplanine High risk; penicillins, cephalosporins, carbapenems, quinolones and clindamycin    Penicillins Rash    Has patient had a PCN reaction causing immediate rash, facial/tongue/throat swelling, SOB or lightheadedness with hypotension: No Has patient had a PCN reaction causing severe rash involving mucus membranes or skin necrosis: No Has patient had a PCN reaction that required hospitalization No Has patient had a PCN reaction occurring within the last 10 years: No If all of the above answers are "NO", then may proceed with Cephalosporin use.     PAST MEDICAL HISTORY Past Medical History:  Diagnosis Date   Allergy    Atherosclerosis of aorta (HCC) 02/18/2018   C. 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 Roda Shutters remortely; sees Dr. Dagoberto Ligas   Solitary pulmonary nodule 02/06/2016   -solitary pulm nodule, incidental finding on abd CT in 2016 -she discussed with ID and me and was indecisive about whether or not to do a repeat 12 month CT scan, she ultimately decided to hold off on the order, but to call us if she changed her mind  -completed follow up per radiology recommendations in 2019   Past Surgical History:   Procedure Laterality Date   CATARACT EXTRACTION Bilateral 2016   Dr. 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   Procedure: FECAL TRANSPLANT;  Surgeon: Iva Boop, MD;  Location: [facility];  Service: Endoscopy;  Laterality: N/A;   LYMPH NODE BIOPSY     benign per patient   lymphnode biopsy     TUBAL LIGATION      FAMILY HISTORY Family History  Problem Relation Age of Onset   Arthritis Mother    Hypertension Mother    Macular degeneration Mother    Cancer Father    Lung cancer Father    Arthritis Sister    Hypertension Sister    Cancer Maternal Aunt    Breast cancer Maternal Aunt    Lung cancer Sister    Colon cancer Neg Hx    Colon polyps Neg Hx    Esophageal cancer Neg Hx    Stomach cancer Neg Hx    Rectal cancer Neg Hx     SOCIAL HISTORY Social History   Tobacco Use   Smoking status: Never   Smokeless tobacco: Never   Tobacco comments:    was around 2ndary smoke  Substance Use Topics   Alcohol use: No   Drug use: No         OPHTHALMIC EXAM:  Base Eye Exam     Visual Acuity (ETDRS)       Right Left   Dist [place] 20/20 20/25 -2         Tonometry (Tonopen, 2:42 PM)       Right Left   Pressure 12 12         Pupils       Pupils Dark Light Shape React APD   Right PERRL 3 2 Round Brisk None   Left PERRL 3 2 Round Brisk None         Visual Fields (Counting fingers)       Left Right     Full         Extraocular Movement       Right Left    Full Full         Neuro/Psych     Oriented x3: Yes   Mood/Affect: Normal         Dilation     Both eyes: 1.0% Mydriacyl, 2.5% Phenylephrine @ 2:43 PM           Slit Lamp and Fundus Exam     External Exam       Right  Left   External Normal  Normal         Slit Lamp Exam       Right Left   Lids/Lashes Normal Normal   Conjunctiva/Sclera White and quiet White and quiet   Cornea Clear Clear   Anterior Chamber Deep and quiet Deep and quiet   Iris Round and reactive Round and reactive   Lens Posterior chamber intraocular lens, PC clear Posterior chamber intraocular lens, PC clear   Anterior Vitreous Normal Normal         Fundus Exam       Right Left   Posterior Vitreous Normal Normal   Disc Normal Normal   C/D Ratio 0.3 0.3   Macula Age related macular degeneration, Early age related macular degeneration, Drusen, Hard drusen, no macular thickening, no hemorrhage, no thickening, no CME Drusen, Hard drusen, Atrophy, Retinal pigment epithelial atrophy, Age related macular degeneration, Early age related macular degeneration, no macular thickening   Vessels Normal, small cluster of 2 or 3 microaneurysms temporal aspect of the macula not perifoveal near small drusen Normal   Periphery Microaneurysms Normal            IMAGING AND PROCEDURES  Imaging and Procedures for 01/23/21  OCT, Retina - OU - Both Eyes       Right Eye Quality was good. Scan locations included subfoveal. Central Foveal Thickness: 271. Progression has been stable. Findings include retinal drusen , vitreomacular adhesion , normal foveal contour.   Left Eye Quality was good. Scan locations included subfoveal. Central Foveal Thickness: 278. Progression has been stable. Findings include epiretinal membrane, normal foveal contour, vitreomacular adhesion .   Notes No physiologic damage from VMA OD or OS,   Minor epiretinal membrane nasal to the fovea left eye with no topographic distortion to the fovea     Color Fundus Photography Optos - OU - Both Eyes       Right Eye Progression has no prior data. Disc findings include normal observations. Macula : drusen. Vessels : normal observations. Periphery : normal observations.    Left Eye Progression has no prior data. Disc findings include normal observations. Macula : drusen. Vessels : normal observations. Periphery : normal observations.              ASSESSMENT/PLAN:  Left epiretinal membrane OS with minor epiretinal membrane on foveal involvement nasal to the FAZ will observe  Vitreomacular adhesion of left eye No intraretinal changes secondary to the physiologic vitreomacular fusion  Early stage nonexudative age-related macular degeneration of both eyes The nature of age--related macular degeneration was discussed with the patient as well as the distinction between dry and wet types. Checking an Amsler Grid daily with advice to return immediately should a distortion develop, was given to the patient. The patient 's smoking status now and in the past was determined and advice based on the AREDS study was provided regarding the consumption of antioxidant supplements. AREDS 2 vitamin formulation was recommended. Consumption of dark leafy vegetables and fresh fruits of various colors was recommended. Treatment modalities for wet macular degeneration particularly the use of intravitreal injections of anti-blood vessel growth factors was discussed with the patient. Avastin, Lucentis, and Eylea are the available options. On occasion, therapy includes the use of photodynamic therapy and thermal laser. Stressed to the patient do not rub eyes.  Patient was advised to check Amsler Grid daily and return immediately if changes are noted. Instructions on using the grid were given to the patient. All patient questions were answered.  Mild OU  stable      ICD-10-CM   1. Left epiretinal membrane  H35.372 OCT, Retina - OU - Both Eyes    Color Fundus Photography Optos - OU - Both Eyes    2. 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 Vitreomacular adhesion of left eye  H43.822 OCT, Retina - OU - Both Eyes    Color Fundus Photography Optos - OU - Both Eyes    4. Early  stage nonexudative age-related macular degeneration of both eyes  H35.3131 OCT, Retina - OU - Both Eyes      1.  OS with minor epiretinal membrane with no significant progression with no impact on acuity continue to observe  2.  ARMD patient does not technically qualify for using oral vitamin supplements for macular degeneration but she certainly has the option to do some  3.  Ophthalmic Meds Ordered this visit:  No orders of the defined types were placed in this encounter.      Return in about 1 year (around 01/23/2022) for DILATE OU, OCT.  There are no Patient Instructions on file for this visit.   Explained the diagnoses, plan, and follow up with the patient and they expressed understanding.  Patient expressed understanding of the importance of proper follow up care.   Alford Highland Vee Bahe M.D. Diseases & Surgery of the Retina and Vitreous [facility] 01/23/21     Abbreviations: M myopia (nearsighted); A astigmatism; H hyperopia (farsighted); P presbyopia; Mrx spectacle prescription;  CTL contact lenses; OD right eye; OS left eye; OU both eyes  XT exotropia; ET esotropia; PEK punctate epithelial keratitis; PEE punctate epithelial erosions; DES dry eye syndrome; MGD meibomian gland dysfunction; ATs artificial tears; PFAT's preservative free artificial tears; NSC nuclear sclerotic cataract; PSC posterior subcapsular cataract; ERM epi-retinal membrane; PVD posterior vitreous detachment; RD retinal detachment; DM diabetes mellitus; DR diabetic retinopathy; NPDR non-proliferative diabetic retinopathy; PDR proliferative diabetic retinopathy; CSME clinically significant macular edema; DME diabetic macular edema; dbh dot blot hemorrhages; CWS cotton wool spot; POAG primary open angle glaucoma; C/D cup-to-disc ratio; HVF humphrey visual field; GVF goldmann visual field; OCT optical coherence tomography; IOP intraocular pressure; BRVO Branch retinal vein occlusion; CRVO central  retinal vein occlusion; CRAO central retinal artery occlusion; BRAO branch retinal artery occlusion; RT retinal tear; SB scleral buckle; PPV pars plana vitrectomy; VH Vitreous hemorrhage; PRP panretinal laser photocoagulation; IVK intravitreal kenalog; VMT vitreomacular traction; MH Macular hole;  NVD neovascularization of the disc; NVE neovascularization elsewhere; AREDS age related eye disease study; ARMD age related macular degeneration; POAG primary open angle glaucoma; EBMD epithelial/anterior basement membrane dystrophy; ACIOL anterior chamber intraocular lens; IOL intraocular lens; PCIOL posterior chamber intraocular lens; Phaco/IOL phacoemulsification with intraocular lens placement; PRK photorefractive keratectomy; LASIK laser assisted in situ keratomileusis; HTN hypertension; DM diabetes mellitus; COPD chronic obstructive pulmonary disease

## 2021-01-23 NOTE — Assessment & Plan Note (Signed)
OS with minor epiretinal membrane on foveal involvement nasal to the FAZ will observe

## 2021-01-23 NOTE — Assessment & Plan Note (Signed)
The nature of age--related macular degeneration was discussed with the patient as well as the distinction between dry and wet types. Checking an Amsler Grid daily with advice to return immediately should a distortion develop, was given to the patient. The patient 's smoking status now and in the past was determined and advice based on the AREDS study was provided regarding the consumption of antioxidant supplements. AREDS 2 vitamin formulation was recommended. Consumption of dark leafy vegetables and fresh fruits of various colors was recommended. Treatment modalities for wet macular degeneration particularly the use of intravitreal injections of anti-blood vessel growth factors was discussed with the patient. Avastin, Lucentis, and Eylea are the available options. On occasion, therapy includes the use of photodynamic therapy and thermal laser. Stressed to the patient do not rub eyes.  Patient was advised to check Amsler Grid daily and return immediately if changes are noted. Instructions on using the grid were given to the patient. All patient questions were answered.  Mild OU stable

## 2021-03-01 ENCOUNTER — Encounter (INDEPENDENT_AMBULATORY_CARE_PROVIDER_SITE_OTHER): Payer: Self-pay

## 2021-03-12 ENCOUNTER — Ambulatory Visit
Admission: RE | Admit: 2021-03-12 | Discharge: 2021-03-12 | Disposition: A | Discharge status: Home / Self Care | Payer: Medicare Other | Source: Ambulatory Visit | Attending: Internal Medicine | Admitting: Internal Medicine

## 2021-03-12 DIAGNOSIS — Z1231 Encounter for screening mammogram for malignant neoplasm of breast: Secondary | ICD-10-CM

## 2021-05-01 ENCOUNTER — Telehealth: Payer: Self-pay | Admitting: Internal Medicine

## 2021-05-01 NOTE — Telephone Encounter (Signed)
Patient is calling in to see if she could get a referral to see a vascular and vein specialists. She already spoke with someone and they requested for someone to fax over a referral.  This can be faxed over to [phone].  Patient could be contacted at [phone] and/ or [phone].  Please advise.

## 2021-05-01 NOTE — Telephone Encounter (Signed)
Pt call and stated she want a call back to go to [facility] to see it she have a DVT and want a call back today.

## 2021-05-01 NOTE — Telephone Encounter (Signed)
Pt states she hit her leg with the car door & believes she has a DVT. Pt last OV 03/11/2019. Pt notified that she will need to see Dr Ardyth Harps before orders of any kind can be placed. Pt frustrated & states she hopes she doesn't have a clot. Pt advised that if she is concerned she needs to go to ER. Pt disagrees b/c she states she will not be seen "for 10hrs". Offered pt earliest appt with provider for 05/02/21 at 7:30. Pt states she will take the 9:30 slot for a referral only.   Dr Ardyth Harps present & aware of the above conversation & agrees with this RN.

## 2021-05-01 NOTE — Telephone Encounter (Signed)
Pt called back and thinks she has DVT and transferred to triage nurse

## 2021-05-02 ENCOUNTER — Ambulatory Visit (INDEPENDENT_AMBULATORY_CARE_PROVIDER_SITE_OTHER): Payer: Medicare Other | Admitting: Internal Medicine

## 2021-05-02 ENCOUNTER — Encounter: Payer: Self-pay | Admitting: Internal Medicine

## 2021-05-02 ENCOUNTER — Telehealth: Payer: Self-pay | Admitting: Internal Medicine

## 2021-05-02 VITALS — BP 120/80 | HR 81 | Temp 98.1°F

## 2021-05-02 DIAGNOSIS — R600 Localized edema: Secondary | ICD-10-CM | POA: Diagnosis not present

## 2021-05-02 NOTE — Telephone Encounter (Signed)
Patient would like to be contacted as well regarding prior message from [place].   Patient could be contacted at [phone].  Please advise.

## 2021-05-02 NOTE — Patient Instructions (Signed)
-  Nice seeing you today!!  -Let us know the results of your dopplers and we can assist you further.  -Schedule follow up for your wellness exam.

## 2021-05-02 NOTE — Addendum Note (Signed)
Addended by: Henderson Cloud on: 05/02/2021 03:14 PM   Modules accepted: Orders

## 2021-05-02 NOTE — Progress Notes (Signed)
Acute office Visit     This visit occurred during the SARS-CoV-2 public health emergency.  Safety protocols were in place, including screening questions prior to the visit, additional usage of staff PPE, and extensive cleaning of exam room while observing appropriate contact time as indicated for disinfecting solutions.    CC/Reason for Visit: "I think I have a blood clot in my leg"  HPI: Monica Lamb is a 81 y.o. female who is coming in today for the above mentioned reasons. Past Medical History is significant for: C. 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  6 days ago she hit her left shin on the corner of her car door.  She has had pain and bruising since.  Redness and bruising has persisted and progressed.  She called yesterday stating that she needed an urgent referral to a vascular specialist because she believes she had a blood clot.  Her sister was hospitalized last year with a DVT/PE.  She was asked to come in the office today as I have not seen her since 2020 for evaluation prior to referral.  She states she has already called the vascular office and they will see her urgently and simply need a referral placed.   Past Medical/Surgical History: Past Medical History:  Diagnosis Date   Allergy    Atherosclerosis of aorta ([place]) 02/18/2018   C. 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 Marina Gravel remortely; sees Dr. Kathrin Penner   Solitary pulmonary nodule 02/06/2016   -solitary pulm nodule, incidental finding on abd CT in 2016 -she discussed with ID and me and was indecisive about whether or not to do a repeat 12 month CT scan, she ultimately  decided to hold off on the order, but to call us if she changed her mind  -completed follow up per radiology recommendations in 2019    Past Surgical History:  Procedure Laterality Date   CATARACT EXTRACTION Bilateral 2016   Dr. 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 She has never used smokeless tobacco. She reports that she does not drink alcohol and does not use drugs.  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 Low risk: Metronidazole, aminoglycosides, vancomycin, chloramphenical Medium risk; tetracyclines, sulfonamides, macrolides and teicoplanine High risk; penicillins, cephalosporins, carbapenems, quinolones and clindamycin    Penicillins Rash    Has patient had a  PCN reaction causing immediate rash, facial/tongue/throat swelling, SOB or lightheadedness with hypotension: No Has patient had a PCN reaction causing severe rash involving mucus membranes or skin necrosis: No Has patient had a PCN reaction that required hospitalization No Has patient had a PCN reaction occurring within the last 10 years: No If all of the above answers are "NO", then may proceed with Cephalosporin use.     Family History:  Family History  Problem Relation Age of Onset   Arthritis Mother    Hypertension Mother    Macular degeneration Mother    Cancer Father    Lung  cancer Father    Arthritis Sister    Hypertension Sister    Cancer Maternal Aunt    Breast cancer Maternal Aunt    Lung cancer Sister    Colon cancer Neg Hx    Colon polyps Neg Hx    Esophageal cancer Neg Hx    Stomach cancer Neg Hx    Rectal cancer Neg Hx      Current Outpatient Medications:    acetaminophen (TYLENOL) 500 MG tablet, Take 500 mg by mouth every 6 (six) hours as needed for mild pain or headache., Disp: , Rfl:    cholecalciferol (VITAMIN D) 1000 units tablet, Take 1,000 Units by mouth daily., Disp: , Rfl:    metroNIDAZOLE (METROCREAM) 0.75 % cream, Apply 1 application topically daily. For rosacea, Disp: , Rfl:    Multiple Vitamins-Minerals (ICAPS AREDS 2 PO), Take by mouth 2 (two) times daily., Disp: , Rfl:    Phenylephrine-Acetaminophen (TYLENOL SINUS+HEADACHE PO), Take 1 tablet by mouth daily as needed (SINUS HEADACHE)., Disp: , Rfl:    Polyethyl Glycol-Propyl Glycol (SYSTANE OP), Apply 1 drop to eye daily as needed (DRY EYES)., Disp: , Rfl:   Review of Systems:  Constitutional: Denies fever, chills, diaphoresis, appetite change and fatigue.  HEENT: Denies photophobia, eye pain, redness, hearing loss, ear pain, congestion, sore throat, rhinorrhea, sneezing, mouth sores, trouble swallowing, neck pain, neck stiffness and tinnitus.   Respiratory: Denies SOB, DOE, cough, chest tightness,  and wheezing.   Cardiovascular: Denies chest pain, palpitations. Gastrointestinal: Denies nausea, vomiting, abdominal pain, diarrhea, constipation, blood in stool and abdominal distention.  Genitourinary: Denies dysuria, urgency, frequency, hematuria, flank pain and difficulty urinating.  Endocrine: Denies: hot or cold intolerance, sweats, changes in hair or nails, polyuria, polydipsia. Musculoskeletal: Denies myalgias, back pain, joint swelling, arthralgias and gait problem.  Skin: Denies pallor, rash and wound.  Neurological: Denies dizziness, seizures, syncope, weakness,  light-headedness, numbness and headaches.  Hematological: Denies adenopathy. Easy bruising, personal or family bleeding history  Psychiatric/Behavioral: Denies suicidal ideation, mood changes, confusion, nervousness, sleep disturbance and agitation    Physical Exam: Vitals:   05/02/21 0939  BP: 120/80  Pulse: 81  Temp: 98.1 F (36.7 C)  TempSrc: Oral  SpO2: 99%    There is no height or weight on file to calculate BMI.   Constitutional: NAD, calm, comfortable, obese Eyes: PERRL, lids and conjunctivae normal ENMT: Mucous membranes are moist.  Skin: She has some bruising over the frontal aspect of her left lower leg with some surrounding erythema.  I do not appreciate any tense edema and in fact both legs are equally edematous.  She does have significant lower leg varicose veins. Neurologic: Grossly intact and nonfocal Psychiatric: Normal judgment and insight. Alert and oriented x 3. Normal mood.    Impression and Plan:  Leg edema, left  - Plan: Ambulatory referral to Vascular Surgery per patient request. -  I think a DVT is unlikely, I think this is more likely a cellulitis. -I have offered to place referral for urgent venous Doppler to be done today but she declines stating that she just wants a referral to the vascular surgeon's office, she will call office to schedule appointment. -She is refusing antibiotic therapy at the moment, states she would prefer to wait results of Doppler. -Asked patient to call us back if she requires further assistance from [place].  Time spent: 30 minutes reviewing chart, interviewing and examining patient and formulating plan of care.   Patient Instructions  -Nice seeing you today!!  -Let us know the results of your dopplers and we can assist you further.  -Schedule follow up for your wellness exam.    Lelon Frohlich, MD [facility]

## 2021-05-02 NOTE — Telephone Encounter (Signed)
Para March with vascular and vein called in stating that patient contacted them and informed them that Dr.Hernandez would put an order in for her to be seen over there. There isn't any orders in on [place] side nor mine; there's only a referral in.  Para March is requesting to speak to someone in clinical to get a better understanding.  Para March could be contacted at [phone].  Please advise.

## 2021-05-03 ENCOUNTER — Other Ambulatory Visit: Payer: Self-pay

## 2021-05-03 ENCOUNTER — Ambulatory Visit (HOSPITAL_COMMUNITY)
Admission: RE | Admit: 2021-05-03 | Discharge: 2021-05-03 | Disposition: A | Discharge status: Home / Self Care | Payer: Medicare Other | Source: Ambulatory Visit | Attending: Internal Medicine | Admitting: Internal Medicine

## 2021-05-03 DIAGNOSIS — R600 Localized edema: Secondary | ICD-10-CM | POA: Diagnosis present

## 2021-05-10 ENCOUNTER — Ambulatory Visit (INDEPENDENT_AMBULATORY_CARE_PROVIDER_SITE_OTHER): Payer: Medicare Other

## 2021-05-10 ENCOUNTER — Other Ambulatory Visit: Payer: Self-pay

## 2021-05-10 VITALS — BP 140/78 | HR 78 | Temp 98.0°F | Ht 63.0 in | Wt 191.1 lb

## 2021-05-10 DIAGNOSIS — Z0000 Encounter for general adult medical examination without abnormal findings: Secondary | ICD-10-CM | POA: Diagnosis not present

## 2021-05-10 DIAGNOSIS — M7989 Other specified soft tissue disorders: Secondary | ICD-10-CM

## 2021-05-10 NOTE — Patient Instructions (Addendum)
[name] , Thank you for taking time to come for your Medicare Wellness Visit. I appreciate your ongoing commitment to your health goals. Please review the following plan we discussed and let me know if I can assist you in the future.   These are the goals we discussed:  Goals      patient     Start to plan trips;  Go back to the [facility] when symptoms resolve      patient     [facility]     Patient Stated     Goal will be to go to [place]         This is a list of the screening recommended for you and due dates:  Health Maintenance  Topic Date Due   COVID-19 Vaccine (4 - Booster for Pfizer series) 05/18/2021*   Zoster (Shingles) Vaccine (1 of 2) 07/30/2021*   Hepatitis C Screening: USPSTF Recommendation to screen - Ages 18-79 yo.  05/10/2022*   Tetanus Vaccine  03/09/2028   Pneumonia Vaccine  Completed   Flu Shot  Completed   DEXA scan (bone density measurement)  Completed   HPV Vaccine  Aged Out   Colon Cancer Screening  Discontinued  *Topic was postponed. The date shown is not the original due date.   Advanced directives: Yes Patient will bring copy  Conditions/risks identified: None  Next appointment: Follow up in one year for your annual wellness visit    Preventive Care 65 Years and Older, Female Preventive care refers to lifestyle choices and visits with your health care provider that can promote health and wellness. What does preventive care include? A yearly physical exam. This is also called an annual well check. Dental exams once or twice a year. Routine eye exams. Ask your health care provider how often you should have your eyes checked. Personal lifestyle choices, including: Daily care of your teeth and gums. Regular physical activity. Eating a healthy diet. Avoiding tobacco and drug use. Limiting alcohol use. Practicing safe sex. Taking low-dose aspirin every day. Taking vitamin and mineral supplements as  recommended by your health care provider. What happens during an annual well check? The services and screenings done by your health care provider during your annual well check will depend on your age, overall health, lifestyle risk factors, and family history of disease. Counseling  Your health care provider may ask you questions about your: Alcohol use. Tobacco use. Drug use. Emotional well-being. Home and relationship well-being. Sexual activity. Eating habits. History of falls. Memory and ability to understand (cognition). Work and work Astronomer. Reproductive health. Screening  You may have the following tests or measurements: Height, weight, and BMI. Blood pressure. Lipid and cholesterol levels. These may be checked every 5 years, or more frequently if you are over 20 years old. Skin check. Lung cancer screening. You may have this screening every year starting at age 34 if you have a 30-pack-year history of smoking and currently smoke or have quit within the past 15 years. Fecal occult blood test (FOBT) of the stool. You may have this test every year starting at age 62. Flexible sigmoidoscopy or colonoscopy. You may have a sigmoidoscopy every 5 years or a colonoscopy every 10 years starting at age 24. Hepatitis C blood test. Hepatitis B blood test. Sexually transmitted disease (STD) testing. Diabetes screening. This is done by checking your blood sugar (glucose) after you have not eaten for a while (fasting). You  may have this done every 1-3 years. Bone density scan. This is done to screen for osteoporosis. You may have this done starting at age 74. Mammogram. This may be done every 1-2 years. Talk to your health care provider about how often you should have regular mammograms. Talk with your health care provider about your test results, treatment options, and if necessary, the need for more tests. Vaccines  Your health care provider may recommend certain vaccines, such  as: Influenza vaccine. This is recommended every year. Tetanus, diphtheria, and acellular pertussis (Tdap, Td) vaccine. You may need a Td booster every 10 years. Zoster vaccine. You may need this after age 60. Pneumococcal 13-valent conjugate (PCV13) vaccine. One dose is recommended after age 70. Pneumococcal polysaccharide (PPSV23) vaccine. One dose is recommended after age 36. Talk to your health care provider about which screenings and vaccines you need and how often you need them. This information is not intended to replace advice given to you by your health care provider. Make sure you discuss any questions you have with your health care provider. Document Released: 04/14/2015 Document Revised: 12/06/2015 Document Reviewed: 01/17/2015 Elsevier Interactive Patient Education  2017 [employer].  Fall Prevention in the Home Falls can cause injuries. They can happen to people of all ages. There are many things you can do to make your home safe and to help prevent falls. What can I do on the outside of my home? Regularly fix the edges of walkways and driveways and fix any cracks. Remove anything that might make you trip as you walk through a door, such as a raised step or threshold. Trim any bushes or trees on the path to your home. Use bright outdoor lighting. Clear any walking paths of anything that might make someone trip, such as rocks or tools. Regularly check to see if handrails are loose or broken. Make sure that both sides of any steps have handrails. Any raised decks and porches should have guardrails on the edges. Have any leaves, snow, or ice cleared regularly. Use sand or salt on walking paths during winter. Clean up any spills in your garage right away. This includes oil or grease spills. What can I do in the bathroom? Use night lights. Install grab bars by the toilet and in the tub and shower. Do not use towel bars as grab bars. Use non-skid mats or decals in the tub or  shower. If you need to sit down in the shower, use a plastic, non-slip stool. Keep the floor dry. Clean up any water that spills on the floor as soon as it happens. Remove soap buildup in the tub or shower regularly. Attach bath mats securely with double-sided non-slip rug tape. Do not have throw rugs and other things on the floor that can make you trip. What can I do in the bedroom? Use night lights. Make sure that you have a light by your bed that is easy to reach. Do not use any sheets or blankets that are too big for your bed. They should not hang down onto the floor. Have a firm chair that has side arms. You can use this for support while you get dressed. Do not have throw rugs and other things on the floor that can make you trip. What can I do in the kitchen? Clean up any spills right away. Avoid walking on wet floors. Keep items that you use a lot in easy-to-reach places. If you need to reach something above you, use a strong  step stool that has a grab bar. Keep electrical cords out of the way. Do not use floor polish or wax that makes floors slippery. If you must use wax, use non-skid floor wax. Do not have throw rugs and other things on the floor that can make you trip. What can I do with my stairs? Do not leave any items on the stairs. Make sure that there are handrails on both sides of the stairs and use them. Fix handrails that are broken or loose. Make sure that handrails are as long as the stairways. Check any carpeting to make sure that it is firmly attached to the stairs. Fix any carpet that is loose or worn. Avoid having throw rugs at the top or bottom of the stairs. If you do have throw rugs, attach them to the floor with carpet tape. Make sure that you have a light switch at the top of the stairs and the bottom of the stairs. If you do not have them, ask someone to add them for you. What else can I do to help prevent falls? Wear shoes that: Do not have high heels. Have  rubber bottoms. Are comfortable and fit you well. Are closed at the toe. Do not wear sandals. If you use a stepladder: Make sure that it is fully opened. Do not climb a closed stepladder. Make sure that both sides of the stepladder are locked into place. Ask someone to hold it for you, if possible. Clearly mark and make sure that you can see: Any grab bars or handrails. First and last steps. Where the edge of each step is. Use tools that help you move around (mobility aids) if they are needed. These include: Canes. Walkers. Scooters. Crutches. Turn on the lights when you go into a dark area. Replace any light bulbs as soon as they burn out. Set up your furniture so you have a clear path. Avoid moving your furniture around. If any of your floors are uneven, fix them. If there are any pets around you, be aware of where they are. Review your medicines with your doctor. Some medicines can make you feel dizzy. This can increase your chance of falling. Ask your doctor what other things that you can do to help prevent falls. This information is not intended to replace advice given to you by your health care provider. Make sure you discuss any questions you have with your health care provider. Document Released: 01/12/2009 Document Revised: 08/24/2015 Document Reviewed: 04/22/2014 Elsevier Interactive Patient Education  2017 [employer].

## 2021-05-10 NOTE — Progress Notes (Signed)
Subjective:   Kurtis BushmanSharon O Yett is a 81 y.o. female who presents for Medicare Annual (Subsequent) preventive examination.  Review of Systems       Objective:    Today's Vitals   05/10/21 1337  BP: 140/78  Pulse: 78  Temp: 98 F (36.7 C)  TempSrc: Oral  Weight: 191 lb 1.6 oz (86.7 kg)  Height: 5\' 3"  (1.6 m)   Body mass index is 33.85 kg/m.  Advanced Directives 05/10/2021 06/25/2019 02/10/2018 02/06/2017 02/06/2017 08/06/2016 08/01/2016  Does Patient Have a Medical Advance Directive? Yes Yes Yes Yes Yes Yes Yes  Type of Estate agentAdvance Directive Healthcare Power of VardamanAttorney;Living will - - - - Living will;Healthcare Power of [place] Healthcare Power of [place];Living will  Does patient want to make changes to medical advance directive? No - Patient declined No - Patient declined - - - - -  Copy of Healthcare Power of Attorney in Chart? No - copy requested - - - - No - copy requested -    Current Medications (verified) Outpatient Encounter Medications as of 05/10/2021  Medication Sig   acetaminophen (TYLENOL) 500 MG tablet Take 500 mg by mouth every 6 (six) hours as needed for mild pain or headache.   cholecalciferol (VITAMIN D) 1000 units tablet Take 1,000 Units by mouth daily.   metroNIDAZOLE (METROCREAM) 0.75 % cream Apply 1 application topically daily. For rosacea   Multiple Vitamins-Minerals (ICAPS AREDS 2 PO) Take by mouth 2 (two) times daily.   Phenylephrine-Acetaminophen (TYLENOL SINUS+HEADACHE PO) Take 1 tablet by mouth daily as needed (SINUS HEADACHE).   Polyethyl Glycol-Propyl Glycol (SYSTANE OP) Apply 1 drop to eye daily as needed (DRY EYES).   No facility-administered encounter medications on file as of 05/10/2021.    Allergies (verified) Clindamycin/lincomycin, Other, and Penicillins   History: Past Medical History:  Diagnosis Date   Allergy    Atherosclerosis of aorta (HCC) 02/18/2018   C. 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 Roda Shuttersaswell remortely; sees Dr. Dagoberto LigasStoneburner   Solitary pulmonary nodule 02/06/2016   -solitary pulm nodule, incidental finding on abd CT in 2016 -she discussed with ID and me and was indecisive about whether or not to do a repeat 12 month CT scan, she ultimately decided to hold off on the order, but to call us if she changed her mind  -completed follow up per radiology recommendations in 2019   Past Surgical History:  Procedure Laterality Date   CATARACT EXTRACTION Bilateral 2016   Dr. 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   Procedure: FECAL TRANSPLANT;  Surgeon: Iva BoopGessner, Carl E, MD;  Location: [facility];  Service: Endoscopy;  Laterality: N/A;   LYMPH NODE BIOPSY     benign per patient   lymphnode biopsy     TUBAL LIGATION     Family History  Problem Relation Age of Onset   Arthritis Mother  Hypertension Mother    Macular degeneration Mother    Cancer Father    Lung cancer Father    Arthritis Sister    Hypertension Sister    Cancer Maternal Aunt    Breast cancer Maternal Aunt    Lung cancer Sister    Colon cancer Neg Hx    Colon polyps Neg Hx    Esophageal cancer Neg Hx    Stomach cancer Neg Hx    Rectal cancer Neg Hx    Social History   Socioeconomic History   Marital status: Widowed    Spouse name: Not on file   Number of children: Not on file   Years of education: Not on file   Highest education level: Professional school degree (e.g., MD, DDS, DVM, JD)  Occupational History   Not on file  Tobacco Use    Smoking status: Never   Smokeless tobacco: Never   Tobacco comments:    was around 2ndary smoke  Substance and Sexual Activity   Alcohol use: No   Drug use: No   Sexual activity: Not Currently  Other Topics Concern   Not on file  Social History Narrative   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[employer], 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      Home Situation: lives alone      Spiritual Beliefs: no church, spiritual side but no formal religion      Lifestyle: no regular exercise; diet is not great - has Research scientist (physical sciences) at [employer]   Social Determinants of Corporate investment banker Strain: Low Risk    Difficulty of Paying Living Expenses: Not hard at all  Food Insecurity: No Food Insecurity   Worried About Programme researcher, broadcasting/film/video in the Last Year: Never true   Barista in the Last Year: Never true  Transportation Needs: Unknown   Freight forwarder (Medical): No   Lack of Transportation (Non-Medical): Patient refused  Physical Activity: Sufficiently Active   Days of Exercise per Week: 4 days   Minutes of Exercise per Session: 40 min  Stress: No Stress Concern Present   Feeling of Stress : Not at all  Social Connections: Moderately Isolated   Frequency of Communication with Friends and Family: More than three times a week   Frequency of Social Gatherings with Friends and Family: More than three times a week   Attends Religious Services: Never   Database administrator or Organizations: Yes   Attends Engineer, structural: More than 4 times per year   Marital Status: Widowed    Tobacco Counseling Counseling given: Not Answered Tobacco comments: was around 2ndary smoke   Clinical Intake:   Diabetic? No  Interpreter Needed?: No Activities of Daily Living In your present state of health, do you have any difficulty performing the following activities: 05/10/2021  Hearing? N  Vision? N  Difficulty concentrating or making decisions? N  Walking or  climbing stairs? N  Dressing or bathing? N  Doing errands, shopping? N  Preparing Food and eating ? N  Using the Toilet? N  In the past six months, have you accidently leaked urine? N  Do you have problems with loss of bowel control? N  Managing your Medications? N  Managing your Finances? N  Housekeeping or managing your Housekeeping? N  Some recent data might be hidden    Patient Care Team: Philip Aspen, Limmie Patricia, MD as PCP - General (Internal Medicine) Judyann Munson, MD as  Consulting Physician (Infectious Diseases) Shea Evans, MD as Consulting Physician (Obstetrics and Gynecology)  Indicate any recent Medical Services you may have received from other than [facility] providers in the past year (date may be approximate).     Assessment:   This is a routine wellness examination for Madora.  Hearing/Vision screen Hearing Screening - Comments:: No difficulty hearing. Vision Screening - Comments:: Wears reading glasses. Followed by Dr Charlotte Sanes  Dietary issues and exercise activities discussed: Current Exercise Habits: Home exercise routine, Type of exercise: walking, Time (Minutes): 40, Frequency (Times/Week): 4, Weekly Exercise (Minutes/Week): 160, Intensity: Moderate, Exercise limited by: None identified   Goals Addressed             This Visit's Progress    patient       [facility].       Depression Screen PHQ 2/9 Scores 05/10/2021 05/02/2021 06/25/2019 03/11/2019 02/10/2018 02/06/2017 10/29/2016  PHQ - 2 Score 0 0 0 0 0 0 0  PHQ- 9 Score 0 0 - 1 - - -    Fall Risk Fall Risk  05/10/2021 05/02/2021 05/01/2021 06/25/2019 03/11/2019  Falls in the past year? 0 0 0 0 0  Number falls in past yr: 0 0 - 0 0  Injury with Fall? 0 0 - - -  Risk for fall due to : No Fall Risks - - - -  Follow up - Falls evaluation completed - - -    FALL RISK PREVENTION PERTAINING TO THE HOME:  Any stairs in or around the home? Yes  If so, are there any without  handrails? No  Home free of loose throw rugs in walkways, pet beds, electrical cords, etc? Yes  Adequate lighting in your home to reduce risk of falls? Yes   ASSISTIVE DEVICES UTILIZED TO PREVENT FALLS:  Life alert? No  Use of a cane, walker or w/c? No  Grab bars in the bathroom? Yes  Shower chair or bench in shower? No  Elevated toilet seat or a handicapped toilet? No   TIMED UP AND GO:  Was the test performed? Yes .  Length of time to ambulate 10 feet: 5 sec.   Gait steady and fast without use of assistive device  Cognitive Function: MMSE - Mini Mental State Exam 02/10/2018 02/06/2017 02/02/2016  Not completed: (No Data) (No Data) (No Data)    Immunizations Immunization History  Administered Date(s) Administered   Fluad Quad(high Dose 65+) 02/03/2019   Influenza, High Dose Seasonal PF 02/06/2016, 02/06/2017, 02/10/2018   Influenza-Unspecified 01/30/2021   PFIZER(Purple Top)SARS-COV-2 Vaccination 05/14/2019, 06/05/2019, 08/30/2020   Pneumococcal Conjugate-13 11/27/2015   Pneumococcal Polysaccharide-23 04/29/2017   Tdap 03/09/2018    TDAP status: Up to date  Flu Vaccine status: Up to date  Pneumococcal vaccine status: Up to date  Covid-19 vaccine status: Completed vaccines  Qualifies for Shingles Vaccine? Yes   Zostavax completed Yes   Shingrix Completed?: Yes  Screening Tests Health Maintenance  Topic Date Due   COVID-19 Vaccine (4 - Booster for Pfizer series) 05/18/2021 (Originally 10/25/2020)   Zoster Vaccines- Shingrix (1 of 2) 07/30/2021 (Originally [birth year]1992)   Hepatitis C Screening  05/10/2022 (Originally 11/05/1958)   TETANUS/TDAP  03/09/2028   Pneumonia Vaccine 60+ Years old  Completed   INFLUENZA VACCINE  Completed   DEXA SCAN  Completed   HPV VACCINES  Aged Out   COLONOSCOPY (Pts 45-81yrs Insurance coverage will need to be confirmed)  Discontinued    Health  Maintenance  There are no preventive care reminders to display for this  patient.   Colorectal cancer screening: No longer required.   Mammogram status: No longer required due to Age.  Bone Density status: Completed 04/01/12. Results reflect: Bone density results: OSTEOPENIA. Repeat every 5 years.  Lung Cancer Screening: (Low Dose CT Chest recommended if Age 77-80 years, 30 pack-year currently smoking OR have quit w/in 15years.) does not qualify.     Additional Screening:  Hepatitis C Screening: does qualify; Completed Patient deferred  Vision Screening: Recommended annual ophthalmology exams for early detection of glaucoma and other disorders of the eye. Is the patient up to date with their annual eye exam?  Yes  Who is the provider or what is the name of the office in which the patient attends annual eye exams? Dr Marygrace Drought If pt is not established with a provider, would they like to be referred to a provider to establish care? No .   Dental Screening: Recommended annual dental exams for proper oral hygiene  Community Resource Referral / Chronic Care Management:  CRR required this visit?  No   CCM required this visit?  No      Plan:     I have personally reviewed and noted the following in the patients chart:   Medical and social history Use of alcohol, tobacco or illicit drugs  Current medications and supplements including opioid prescriptions.  Functional ability and status Nutritional status Physical activity Advanced directives List of other physicians Hospitalizations, surgeries, and ER visits in previous 12 months Vitals Screenings to include cognitive, depression, and falls Referrals and appointments  In addition, I have reviewed and discussed with patient certain preventive protocols, quality metrics, and best practice recommendations. A written personalized care plan for preventive services as well as general preventive health recommendations were provided to patient.     Tillie Rung, LPN   05/10/5186   Nurse Notes:  None

## 2021-05-11 ENCOUNTER — Telehealth: Payer: Self-pay

## 2021-05-11 ENCOUNTER — Ambulatory Visit (HOSPITAL_COMMUNITY)
Admission: RE | Admit: 2021-05-11 | Discharge: 2021-05-11 | Disposition: A | Discharge status: Home / Self Care | Payer: Medicare Other | Source: Ambulatory Visit | Attending: Vascular Surgery | Admitting: Vascular Surgery

## 2021-05-11 ENCOUNTER — Other Ambulatory Visit: Payer: Self-pay

## 2021-05-11 ENCOUNTER — Other Ambulatory Visit: Payer: Self-pay | Admitting: Vascular Surgery

## 2021-05-11 DIAGNOSIS — M7989 Other specified soft tissue disorders: Secondary | ICD-10-CM

## 2021-05-11 DIAGNOSIS — I82890 Acute embolism and thrombosis of other specified veins: Secondary | ICD-10-CM | POA: Diagnosis present

## 2021-05-11 NOTE — Telephone Encounter (Signed)
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 Will need follow up with PCP.

## 2021-05-11 NOTE — Telephone Encounter (Signed)
Called the patient to get her scheduled for a follow up on Monday per Helene's recommendations. Left message for patient to call back and schedule an appointment.

## 2021-05-11 NOTE — Telephone Encounter (Signed)
Pt has an appt sch for 05-14-2021

## 2021-05-11 NOTE — Telephone Encounter (Signed)
Spoke with helene, vascular tech stated that a reflux is not needed due to patient having a positive thrombus. She is changing the order a lower extremity venus duplex.

## 2021-05-14 ENCOUNTER — Ambulatory Visit (INDEPENDENT_AMBULATORY_CARE_PROVIDER_SITE_OTHER): Payer: Medicare Other | Admitting: Internal Medicine

## 2021-05-14 VITALS — BP 130/80 | HR 92 | Temp 97.6°F

## 2021-05-14 DIAGNOSIS — I8000 Phlebitis and thrombophlebitis of superficial vessels of unspecified lower extremity: Secondary | ICD-10-CM

## 2021-05-14 MED ORDER — RIVAROXABAN (XARELTO) VTE STARTER PACK (15 & 20 MG): ORAL_TABLET | ORAL | 0 refills | Status: DC

## 2021-05-14 MED ORDER — RIVAROXABAN 20 MG PO TABS: 20.0000 mg | ORAL_TABLET | Freq: Every day | ORAL | 1 refills | Status: DC

## 2021-05-14 NOTE — Patient Instructions (Signed)
-  Nice seeing you today!!  -Start Xarelto 15 mg twice daily for 21 days and then transition to 21 mg daily for a total of 3 months.  -Make sure to call vein and vascular for follow up appointment.

## 2021-05-14 NOTE — Progress Notes (Signed)
Established Patient Office Visit     This visit occurred during the SARS-CoV-2 public health emergency.  Safety protocols were in place, including screening questions prior to the visit, additional usage of staff PPE, and extensive cleaning of exam room while observing appropriate contact time as indicated for disinfecting solutions.    CC/Reason for Visit: Discuss venous Doppler results  HPI: Monica Lamb is a 81 y.o. female who is coming in today for the above mentioned reasons.  I last saw her on February 1.  She had concerns about potential clotting after a traumatic injury to her left lower leg and she was referred for a venous Doppler that showed some superficial vein thrombosis.  She went back to have a repeat venous Doppler a few days after the initial one because of a palpable cord and knots around the inside of her knee and mid thigh.  This Doppler showed a thrombus extending into her greater saphenous vein around the mid thigh area: Please see official Doppler result below:  LEFT:  - There is no evidence of deep vein thrombosis in the lower extremity.  - Superficial vein thrombus in the varicosity seen on the 05/03/21 exam now  propagated to GSV up to the mid thigh.  The vascular technician notified our office to get her scheduled in for follow-up and she is here for this purpose.   Past Medical/Surgical History: Past Medical History:  Diagnosis Date   Allergy    Atherosclerosis of aorta ([place]) 02/18/2018   C. 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 Marina Gravel remortely; sees Dr. Kathrin Penner   Solitary pulmonary nodule 02/06/2016   -solitary pulm nodule, incidental finding on abd CT in 2016 -she discussed with ID and me and was  indecisive about whether or not to do a repeat 12 month CT scan, she ultimately decided to hold off on the order, but to call us if she changed her mind  -completed follow up per radiology recommendations in 2019    Past Surgical History:  Procedure Laterality Date   CATARACT EXTRACTION Bilateral 2016   Dr. 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 She has never used smokeless tobacco. She reports that she does not drink alcohol and does not use drugs.  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 Low risk: Metronidazole, aminoglycosides, vancomycin, chloramphenical Medium risk; tetracyclines, sulfonamides, macrolides and teicoplanine High risk; penicillins, cephalosporins, carbapenems, quinolones and clindamycin  Penicillins Rash    Has patient had a PCN reaction causing immediate rash, facial/tongue/throat swelling, SOB or lightheadedness with hypotension: No Has patient had a PCN reaction causing severe rash involving mucus membranes or skin necrosis: No Has patient had a PCN reaction that required hospitalization No Has patient had a PCN reaction occurring within the last 10 years: No If all of the above answers are "NO", then may proceed with Cephalosporin use.     Family History:  Family History  Problem Relation Age of Onset   Arthritis Mother     Hypertension Mother    Macular degeneration Mother    Cancer Father    Lung cancer Father    Arthritis Sister    Hypertension Sister    Cancer Maternal Aunt    Breast cancer Maternal Aunt    Lung cancer Sister    Colon cancer Neg Hx    Colon polyps Neg Hx    Esophageal cancer Neg Hx    Stomach cancer Neg Hx    Rectal cancer Neg Hx      Current Outpatient Medications:    acetaminophen (TYLENOL) 500 MG tablet, Take 500 mg by mouth every 6 (six) hours as needed for mild pain or headache., Disp: , Rfl:    cholecalciferol (VITAMIN D) 1000 units tablet, Take 1,000 Units by mouth daily., Disp: , Rfl:    metroNIDAZOLE (METROCREAM) 0.75 % cream, Apply 1 application topically daily. For rosacea, Disp: , Rfl:    Multiple Vitamins-Minerals (ICAPS AREDS 2 PO), Take by mouth 2 (two) times daily., Disp: , Rfl:    Phenylephrine-Acetaminophen (TYLENOL SINUS+HEADACHE PO), Take 1 tablet by mouth daily as needed (SINUS HEADACHE)., Disp: , Rfl:    Polyethyl Glycol-Propyl Glycol (SYSTANE OP), Apply 1 drop to eye daily as needed (DRY EYES)., Disp: , Rfl:    rivaroxaban (XARELTO) 20 MG TABS tablet, Take 1 tablet (20 mg total) by mouth daily with supper., Disp: 30 tablet, Rfl: 1   RIVAROXABAN (XARELTO) VTE STARTER PACK (15 & 20 MG), Follow package directions: Take one 15mg  tablet by mouth twice a day. On day 22, switch to one 20mg  tablet once a day. Take with food., Disp: 51 each, Rfl: 0  Review of Systems:  Constitutional: Denies fever, chills, diaphoresis, appetite change and fatigue.  HEENT: Denies photophobia, eye pain, redness, hearing loss, ear pain, congestion, sore throat, rhinorrhea, sneezing, mouth sores, trouble swallowing, neck pain, neck stiffness and tinnitus.   Respiratory: Denies SOB, DOE, cough, chest tightness,  and wheezing.   Cardiovascular: Denies chest pain, palpitations and leg swelling.  Gastrointestinal: Denies nausea, vomiting, abdominal pain, diarrhea, constipation, blood in stool  and abdominal distention.  Genitourinary: Denies dysuria, urgency, frequency, hematuria, flank pain and difficulty urinating.  Endocrine: Denies: hot or cold intolerance, sweats, changes in hair or nails, polyuria, polydipsia. Musculoskeletal: Denies myalgias, back pain, joint swelling, arthralgias and gait problem.  Skin: Denies pallor, rash and wound.  Neurological: Denies dizziness, seizures, syncope, weakness, light-headedness, numbness and headaches.  Hematological: Denies adenopathy. Easy bruising, personal or family bleeding history  Psychiatric/Behavioral: Denies suicidal ideation, mood changes, confusion, nervousness, sleep disturbance and agitation    Physical Exam: Vitals:   05/14/21 1105  BP: 130/80  Pulse: 92  Temp: 97.6 F (36.4 C)  TempSrc: Oral  SpO2: 98%    There is no height or weight on file to calculate BMI.   Constitutional: NAD, calm, comfortable Eyes: PERRL, lids and conjunctivae normal ENMT: Mucous membranes are moist.  Psychiatric: Normal judgment  and insight. Alert and oriented x 3. Normal mood.    Impression and Plan:  Thrombophlebitis of superficial vein of lower leg  - Plan: RIVAROXABAN (XARELTO) VTE STARTER PACK (15 & 20 MG), rivaroxaban (XARELTO) 20 MG TABS tablet  -Because the location of her superficial vein thrombosis is of the left greater saphenous vein around the mid thigh area, I feel it is prudent to treat as a DVT with anticoagulation for 3 months.  Will start with Xarelto 15 mg twice daily for 21 days and then transitioning to 20 mg daily. -Refer to vascular surgery as well for input.  Time spent: 32 minutes reviewing chart, interviewing and examining patient and formulating plan of care.   Patient Instructions  -Nice seeing you today!!  -Start Xarelto 15 mg twice daily for 21 days and then transition to 21 mg daily for a total of 3 months.  -Make sure to call vein and vascular for follow up appointment.      Lelon Frohlich, MD [facility]

## 2021-05-15 ENCOUNTER — Ambulatory Visit (INDEPENDENT_AMBULATORY_CARE_PROVIDER_SITE_OTHER): Payer: Medicare Other | Admitting: Vascular Surgery

## 2021-05-15 ENCOUNTER — Encounter: Payer: Medicare Other | Admitting: Vascular Surgery

## 2021-05-15 ENCOUNTER — Other Ambulatory Visit: Payer: Self-pay

## 2021-05-15 ENCOUNTER — Encounter: Payer: Self-pay | Admitting: Vascular Surgery

## 2021-05-15 VITALS — BP 155/83 | HR 90 | Temp 98.3°F | Resp 20 | Ht 63.0 in | Wt 191.0 lb

## 2021-05-15 DIAGNOSIS — I8002 Phlebitis and thrombophlebitis of superficial vessels of left lower extremity: Secondary | ICD-10-CM

## 2021-05-15 NOTE — Progress Notes (Signed)
[facility] [place]  ASSESSMENT / PLAN: 81 y.o. female with progressive thrombophlebitis of the left greater saphenous vein.  Her primary care doctor has astutely started anticoagulation for this.  Recommend continued compression, elevation, exercise.  I 13-month course of anticoagulation will be sufficient.  She does not need repeat duplex unless she has a clinical change.  She can follow-up with me as needed.  CHIEF COMPLAINT: Left leg pain and thrombophlebitis  HISTORY OF PRESENT ILLNESS: Monica Lamb is a 81 y.o. female referred by Dr. Jerilee Hoh for evaluation of left leg thrombophlebitis which has progressed on serial duplex.  The patient reports a traumatic injury to the shin which was followed by pain in the calf.  The pain progressed.  She developed a palpable cord in the left medial and posterior calf and knee.  She saw her primary care physician for evaluation.  A duplex was ordered.  This showed a focal greater saphenous vein thrombus.  She then had worsening of symptoms.  A repeat duplex was performed and showed progression of greater saphenous vein thrombus.  She was started on anticoagulation.  She has a sister who suffered a DVT and pulmonary embolism.  She is concerned about a potential thrombophilia.  We had a long discussion about the natural history of thrombophlebitis, hereditary thrombophilias, and the best management for thrombophlebitis.  Past Medical History:  Diagnosis Date   Allergy    Atherosclerosis of aorta ([place]) 02/18/2018   C. 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 Marina Gravel remortely; sees Dr. Kathrin Penner   Solitary pulmonary nodule 02/06/2016   -solitary pulm nodule, incidental finding on abd CT in  2016 -she discussed with ID and me and was indecisive about whether or not to do a repeat 12 month CT scan, she ultimately decided to hold off on the order, but to call us if she changed her mind  -completed follow up per radiology recommendations in 2019    Past Surgical History:  Procedure Laterality Date   CATARACT EXTRACTION Bilateral 2016   Dr. 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   Procedure: FECAL TRANSPLANT;  Surgeon: Gatha Mayer, MD;  Location: [facility];  Service: Endoscopy;  Laterality: N/A;   LYMPH NODE BIOPSY     benign per patient   lymphnode biopsy     TUBAL LIGATION      Family History  Problem Relation Age of Onset   Arthritis Mother    Hypertension Mother    Macular degeneration Mother    Cancer Father    Lung cancer Father    Arthritis Sister    Hypertension Sister    Cancer Maternal Aunt    Breast cancer Maternal Aunt    Lung cancer Sister    Colon cancer Neg Hx    Colon polyps Neg Hx    Esophageal cancer Neg Hx    Stomach  cancer Neg Hx    Rectal cancer Neg Hx     Social History   Socioeconomic History   Marital status: Widowed    Spouse name: Not on file   Number of children: Not on file   Years of education: Not on file   Highest education level: Professional school degree (e.g., MD, DDS, DVM, JD)  Occupational History   Not on file  Tobacco Use   Smoking status: Never   Smokeless tobacco: Never   Tobacco comments:    was around 2ndary smoke  Substance and Sexual Activity   Alcohol use: No   Drug use: No   Sexual activity: Not Currently  Other Topics Concern   Not on file  Social History Narrative   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[employer], 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      Home Situation: lives alone      Spiritual Beliefs: no church, spiritual side but no formal religion      Lifestyle: no regular exercise; diet is not great - has Higher education careers adviser at Chief Operating [employer] center   Social Determinants of Radio broadcast assistant Strain: Low Risk    Difficulty of Paying Living Expenses: Not hard at all  Food Insecurity: No Food Insecurity   Worried About Charity fundraiser in the Last Year: Never true   Arboriculturist in the Last Year: Never true  Transportation Needs: Unknown   Film/video editor (Medical): No   Lack of Transportation (Non-Medical): Patient refused  Physical Activity: Sufficiently Active   Days of Exercise per Week: 4 days   Minutes of Exercise per Session: 40 min  Stress: No Stress Concern Present   Feeling of Stress : Not at all  Social Connections: Moderately Isolated   Frequency of Communication with Friends and Family: More than three times a week   Frequency of Social Gatherings with Friends and Family: More than three times a week   Attends Religious Services: Never   Marine scientist or Organizations: Yes   Attends Music therapist: More than 4 times per year   Marital Status: Widowed  Human resources officer Violence: Not At Risk   Fear of Current or Ex-Partner: No   Emotionally Abused: No   Physically Abused: No   Sexually Abused: No    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 Low risk: Metronidazole, aminoglycosides, vancomycin, chloramphenical Medium risk; tetracyclines, sulfonamides, macrolides and teicoplanine High risk; penicillins, cephalosporins, carbapenems, quinolones and clindamycin    Penicillins Rash    Has patient had a PCN reaction causing immediate rash, facial/tongue/throat swelling, SOB or lightheadedness with hypotension: No Has patient had a PCN reaction causing severe rash  involving mucus membranes or skin necrosis: No Has patient had a PCN reaction that required hospitalization No Has patient had a PCN reaction occurring within the last 10 years: No If all of the above answers are "NO", then may proceed with Cephalosporin use.     Current Outpatient Medications  Medication Sig Dispense Refill   acetaminophen (TYLENOL) 500 MG tablet Take 500 mg by mouth every 6 (six) hours as needed for mild pain or headache.     cholecalciferol (VITAMIN D) 1000 units tablet Take 1,000 Units by mouth daily.     metroNIDAZOLE (METROCREAM) 0.75 % cream Apply 1 application topically daily. For rosacea  Multiple Vitamins-Minerals (ICAPS AREDS 2 PO) Take by mouth 2 (two) times daily.     Phenylephrine-Acetaminophen (TYLENOL SINUS+HEADACHE PO) Take 1 tablet by mouth daily as needed (SINUS HEADACHE).     Polyethyl Glycol-Propyl Glycol (SYSTANE OP) Apply 1 drop to eye daily as needed (DRY EYES).     rivaroxaban (XARELTO) 20 MG TABS tablet Take 1 tablet (20 mg total) by mouth daily with supper. 30 tablet 1   RIVAROXABAN (XARELTO) VTE STARTER PACK (15 & 20 MG) Follow package directions: Take one 15mg  tablet by mouth twice a day. On day 22, switch to one 20mg  tablet once a day. Take with food. 51 each 0   No current facility-administered medications for this visit.    PHYSICAL EXAM Vitals:   05/15/21 1449  BP: (!) 155/83  Pulse: 90  Resp: 20  Temp: 98.3 F (36.8 C)  SpO2: 95%  Weight: 191 lb (86.6 kg)  Height: 5\' 3"  (1.6 m)    Constitutional: Well-appearing in no acute distress. Neurologic: Cranial nerves intact.  No focal findings Psychiatric:  Mood and affect symmetric and appropriate. Eyes:  No icterus. No conjunctival pallor. Ears, nose, throat:  mucous membranes moist. Midline trachea.  Cardiac: reg rate and rhythm.  Respiratory:  unlabored. Abdominal:  soft, non-tender, non-distended.  Peripheral vascular: Palpable cord in the left calf consistent with  thrombophlebitis of the greater saphenous vein.  2+ dorsalis pedis pulses bilaterally. Extremity: mild edema. no cyanosis. no pallor.  Skin: no gangrene. no ulceration.  Lymphatic: no Stemmer's sign. no palpable lymphadenopathy.  PERTINENT LABORATORY AND RADIOLOGIC DATA  Most recent CBC CBC Latest Ref Rng & Units 03/11/2019 07/25/2016  WBC 4.0 - 10.5 K/uL 8.0 8.0  Hemoglobin 12.0 - 15.0 g/dL 12.8 13.2  Hematocrit 36.0 - 46.0 % 39.3 41.3  Platelets 150.0 - 400.0 K/uL 330.0 345     Most recent CMP CMP Latest Ref Rng & Units 03/11/2019 07/25/2016  Glucose 70 - 99 mg/dL 92 79  BUN 6 - 23 mg/dL 14 15  Creatinine 0.40 - 1.20 mg/dL 0.68 0.67  Sodium 135 - 145 mEq/L 139 141  Potassium 3.5 - 5.1 mEq/L 4.2 4.0  Chloride 96 - 112 mEq/L 102 105  CO2 19 - 32 mEq/L 29 27  Calcium 8.4 - 10.5 mg/dL 9.2 9.2  Total Protein 6.0 - 8.3 g/dL 6.8 6.4  Total Bilirubin 0.2 - 1.2 mg/dL 0.5 0.3  Alkaline Phos 39 - 117 U/L 78 71  AST 0 - 37 U/L 15 15  ALT 0 - 35 U/L 11 10    Renal function CrCl cannot be calculated (Patient's most recent lab result is older than the maximum 21 days allowed.).  Hgb A1c MFr Bld (%)  Date Value  02/06/2016 5.7    LDL Cholesterol  Date Value Ref Range Status  03/11/2019 95 0 - 99 mg/dL Final    Serial venous duplex reviewed personally.  Progression of greater saphenous vein thrombus noted.  Yevonne Aline. Stanford Breed, MD [facility] Phone Number: [phone] 05/15/2021 5:31 PM  Total time spent on preparing this encounter including chart review, data review, collecting history, examining the patient, coordinating care for this new patient, 45 minutes.  Portions of this report may have been transcribed using voice recognition software.  Every effort has been made to ensure accuracy; however, inadvertent computerized transcription errors may still be present.

## 2021-05-25 ENCOUNTER — Ambulatory Visit (INDEPENDENT_AMBULATORY_CARE_PROVIDER_SITE_OTHER): Payer: Medicare Other | Admitting: Family Medicine

## 2021-05-25 ENCOUNTER — Encounter: Payer: Self-pay | Admitting: Family Medicine

## 2021-05-25 VITALS — BP 138/82 | HR 89 | Resp 16 | Ht 63.0 in | Wt 182.0 lb

## 2021-05-25 DIAGNOSIS — R600 Localized edema: Secondary | ICD-10-CM | POA: Diagnosis not present

## 2021-05-25 DIAGNOSIS — L538 Other specified erythematous conditions: Secondary | ICD-10-CM | POA: Diagnosis not present

## 2021-05-25 MED ORDER — DESONIDE 0.05 % EX OINT: 1.0000 "application " | TOPICAL_OINTMENT | Freq: Two times a day (BID) | CUTANEOUS | 0 refills | Status: AC

## 2021-05-25 NOTE — Progress Notes (Signed)
ACUTE VISIT Chief Complaint  Patient presents with   puffiness under lip    Noticed it Tuesday night or Wednesday morning; had covid vaccine booster on Monday. Has not had any reactions to prior vaccines. Also started [place] a few weeks ago.    HPI: Monica Lamb is a 81 y.o. female, who is here today complaining of edema and minimal erythema under lower lip. Slight erythema middle of upper lip and "flaky" skin. Negative for urticaria like rash. Negative for pruritus or tenderness. No prior hx. Area feels "puffy."  Xarelto started last week and COVID-19 vaccination a couple days before problem started, she wonders if the latter one could have caused the problem problem. Negative for fever, chills, dysphonia, dysphagia, oral edema, cough, wheezing, SOB,stridor, nausea, changes in bowel habits,or myalgias. If seems to be slightly improving. She has not tried OTC medication, she has applied moisturizer.  Negative for detergent, soap, or body product. No known insect bite or outdoor exposures to plants.  Review of Systems  Constitutional:  Negative for activity change, appetite change and fatigue.  HENT:  Negative for mouth sores, nosebleeds and sore throat.   Eyes:  Negative for discharge and redness.  Cardiovascular:  Negative for chest pain, palpitations and leg swelling.  Gastrointestinal:  Negative for abdominal pain and vomiting.  Genitourinary:  Negative for decreased urine volume and hematuria.  Neurological:  Negative for syncope, facial asymmetry, weakness and headaches.  Rest see pertinent positives and negatives per HPI.  Current Outpatient Medications on File Prior to Visit  Medication Sig Dispense Refill   acetaminophen (TYLENOL) 500 MG tablet Take 500 mg by mouth every 6 (six) hours as needed for mild pain or headache.     cholecalciferol (VITAMIN D) 1000 units tablet Take 1,000 Units by mouth daily.     metroNIDAZOLE (METROCREAM) 0.75 % cream Apply 1  application topically daily. For rosacea     Multiple Vitamins-Minerals (ICAPS AREDS 2 PO) Take by mouth 2 (two) times daily.     Phenylephrine-Acetaminophen (TYLENOL SINUS+HEADACHE PO) Take 1 tablet by mouth daily as needed (SINUS HEADACHE).     Polyethyl Glycol-Propyl Glycol (SYSTANE OP) Apply 1 drop to eye daily as needed (DRY EYES).     rivaroxaban (XARELTO) 20 MG TABS tablet Take 1 tablet (20 mg total) by mouth daily with supper. 30 tablet 1   RIVAROXABAN (XARELTO) VTE STARTER PACK (15 & 20 MG) Follow package directions: Take one 15mg  tablet by mouth twice a day. On day 22, switch to one 20mg  tablet once a day. Take with food. 51 each 0   No current facility-administered medications on file prior to visit.     Past Medical History:  Diagnosis Date   Allergy    Atherosclerosis of aorta ([place]) 02/18/2018   C. 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 Marina Gravel remortely; sees Dr. 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 Low risk: Metronidazole, aminoglycosides, vancomycin, chloramphenical Medium risk; tetracyclines, sulfonamides, macrolides and teicoplanine High risk; penicillins, cephalosporins, carbapenems, quinolones and clindamycin    Penicillins Rash    Has patient had a PCN reaction  causing immediate rash, facial/tongue/throat swelling, SOB or lightheadedness with hypotension: No Has patient had a PCN reaction causing severe rash involving mucus membranes or skin necrosis: No Has patient had a PCN reaction that required hospitalization No Has patient had a PCN reaction occurring within the last 10 years: No If all of the above answers are "NO", then may proceed with Cephalosporin use.     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      Home Situation: lives alone      Spiritual Beliefs: no church, spiritual side but no formal religion      Lifestyle: no regular exercise; diet is not great - has Higher education careers adviser at [employer] center   Social Determinants of Radio broadcast assistant Strain: Low Risk    Difficulty of Paying Living Expenses: Not hard at all  Food Insecurity: No Food Insecurity   Worried About Charity fundraiser in the Last Year: Never true   Arboriculturist in the Last Year: Never true  Transportation Needs: Unknown   Film/video editor (Medical): No   Lack of Transportation (Non-Medical): Patient refused  Physical Activity: Sufficiently Active   Days of Exercise per Week: 4 days   Minutes of Exercise per Session: 40 min  Stress: No Stress Concern Present   Feeling of Stress : Not at all  Social Connections: Moderately Isolated   Frequency of  Communication with Friends and Family: More than three times a week   Frequency of Social Gatherings with Friends and Family: More than three times a week   Attends Religious Services: Never   Marine scientist or Organizations: Yes   Attends Music therapist: More than 4 times per year   Marital Status: Widowed   Vitals:   05/25/21 1132  BP: 138/82  Pulse: 89  Resp: 16  SpO2: 97%   Body mass index is 32.24 kg/m.  Physical Exam Vitals and nursing note reviewed.  Constitutional:      General: She is not in acute distress.    Appearance: She is well-developed.  HENT:     Head: Normocephalic and atraumatic.     Mouth/Throat:     Mouth: Mucous membranes are moist.     Pharynx: Oropharynx is clear.      Comments:  Mild edema localized right under lower lip,mostly left side. Peeling skin. No tenderness or induration. Oral mucosa of not compromised. Mild erythema above upper lip, no edema.  Eyes:     Conjunctiva/sclera: Conjunctivae normal.  Cardiovascular:     Rate and Rhythm: Normal rate and regular rhythm.     Heart sounds: No murmur heard. Pulmonary:     Effort: Pulmonary effort is normal. No respiratory distress.     Breath sounds: Normal breath sounds.  Lymphadenopathy:     Head:     Right side of head: No submandibular adenopathy.     Left side of head: No submandibular adenopathy.     Cervical: No cervical adenopathy.  Skin:    General: Skin is warm.     Findings: Rash present.  Neurological:     General: No focal deficit present.     Mental Status: She is alert and oriented to person, place, and time.  Psychiatric:     Comments: Well groomed, good eye contact.   ASSESSMENT AND PLAN:  Monica Lamb was seen today for puffiness under lip.  Diagnoses and all orders for this visit:  Lip edema ? Angioedema. Very mild and not affecting oral mucosa. It is difficult to determine if COVID-19 vaccination was the trigger factor but recommended  mentioning if she plans to get another booster. it seems to be getting better. Zyrtec 10 mg daily x 2 weeks. Because she just got COVID 19 vaccination, I prefer to hold on systemic steroid. Clearly instructed about warning signs.  -     desonide (DESOWEN) 0.05 % ointment; Apply 1 application topically 2 (two) times daily for 14 days.  Macular erythematous rash Mild macular rash affecting area under and above lips. Topical Desonide x 14 d recommended, small amount at the time. Follow with her dermatologist if needed.  -     desonide (DESOWEN) 0.05 % ointment; Apply 1 application topically 2 (two) times daily for 14 days.  Return if symptoms worsen or fail to improve.  Myson Levi G. [place], MD  [facility]. [place] office.

## 2021-05-25 NOTE — Patient Instructions (Signed)
A few things to remember from today's visit:   Lip edema - Plan: desonide (DESOWEN) 0.05 % ointment  Not known cause, not sure if related to vaccination. Will treat as local reaction with topical steroid, small amount 2 times daily for 14 days. Over the counter zyrtec 10 mg daily for 2 weeks. Monitor for worsening symptoms or new symptoms.  Do not use My Chart to request refills or for acute issues that need immediate attention.   Please be sure medication list is accurate. If a new problem present, please set up appointment sooner than planned today.

## 2021-05-27 ENCOUNTER — Ambulatory Visit (HOSPITAL_COMMUNITY)
Admission: EM | Admit: 2021-05-27 | Discharge: 2021-05-27 | Disposition: A | Discharge status: Home / Self Care | Payer: Medicare Other | Attending: Physician Assistant | Admitting: Physician Assistant

## 2021-05-27 ENCOUNTER — Encounter (HOSPITAL_COMMUNITY): Payer: Self-pay | Admitting: Emergency Medicine

## 2021-05-27 ENCOUNTER — Other Ambulatory Visit: Payer: Self-pay

## 2021-05-27 DIAGNOSIS — R300 Dysuria: Secondary | ICD-10-CM | POA: Diagnosis not present

## 2021-05-27 LAB — POCT URINALYSIS DIPSTICK, ED / UC
Bilirubin Urine: NEGATIVE
Glucose, UA: NEGATIVE mg/dL
Ketones, ur: NEGATIVE mg/dL
Nitrite: NEGATIVE
Protein, ur: 30 mg/dL — AB
Specific Gravity, Urine: 1.015 (ref 1.005–1.030)
Urobilinogen, UA: 0.2 mg/dL (ref 0.0–1.0)
pH: 7 (ref 5.0–8.0)

## 2021-05-27 NOTE — ED Provider Notes (Signed)
Monica Lamb    CSN: [id number] Arrival date & time: 05/27/21  1303      History   Chief Complaint Chief Complaint  Patient presents with   Urinary Tract Infection    HPI Monica Lamb is a 81 y.o. female.   Pt complains of intermittent dysuria over the last week along with foul smelling urine, cloudy urine.  She denies fever, chills, abdominal pain, flank pain.  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  She is very cautious to start an antibiotic.     Past Medical History:  Diagnosis Date   Allergy    Atherosclerosis of aorta ([place]) 02/18/2018   C. 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 Marina Gravel remortely; sees Dr. 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   Procedure: FECAL TRANSPLANT;  Surgeon: Gatha Mayer, MD;  Location: [facility];  Service: Endoscopy;  Laterality: N/A;   LYMPH NODE BIOPSY     benign per patient   lymphnode biopsy     TUBAL LIGATION      OB History   No obstetric history on file.      Home Medications    Prior to Admission medications   Medication Sig Start Date End Date Taking? Authorizing Provider  acetaminophen (TYLENOL) 500 MG tablet Take 500 mg by mouth every 6 (six) hours as needed for mild pain or headache.    [provider]  cholecalciferol (VITAMIN D) 1000 units tablet Take 1,000 Units by mouth daily.    [provider]  desonide (DESOWEN) 0.05 % ointment Apply 1 application topically 2 (two) times daily for 14 days. 05/25/21 06/08/21  Martinique, Betty G, MD  metroNIDAZOLE (METROCREAM) 0.75 % cream Apply 1 application topically daily. For rosacea    [provider]  Multiple Vitamins-Minerals (ICAPS AREDS 2 PO) Take by mouth 2 (two) times daily.    [provider]  Phenylephrine-Acetaminophen (TYLENOL SINUS+HEADACHE PO) Take 1 tablet by mouth daily as needed (SINUS HEADACHE).    [provider]  Polyethyl Glycol-Propyl Glycol (SYSTANE OP) Apply 1 drop to eye daily as needed (DRY EYES).    [provider]  rivaroxaban (XARELTO) 20 MG TABS tablet Take 1 tablet (20 mg total) by mouth daily with supper. 05/14/21   Isaac Bliss, Rayford Halsted, MD  RIVAROXABAN [employer]) VTE STARTER PACK (15 & 20 MG) Follow package directions: Take one 15mg  tablet by mouth twice a day. On day 22, switch to one 20mg  tablet once  a day. Take with food. 05/14/21   Isaac Bliss, Rayford Halsted, MD    Family History Family History  Problem Relation Age of Onset   Arthritis Mother    Hypertension Mother    Macular degeneration Mother    Cancer Father    Lung cancer Father    Arthritis Sister    Hypertension Sister    Cancer Maternal Aunt    Breast cancer Maternal Aunt    Lung cancer Sister    Colon cancer Neg Hx    Colon polyps Neg Hx    Esophageal cancer Neg Hx    Stomach cancer Neg Hx    Rectal cancer Neg Hx     Social History Social History   Tobacco Use   Smoking status: Never   Smokeless tobacco: Never   Tobacco comments:    was around 2ndary smoke  Vaping Use   Vaping Use: Never used  Substance Use Topics   Alcohol use: No   Drug use: No     Allergies   Clindamycin/lincomycin, Other, and Penicillins   Review of Systems Review of Systems  Constitutional:  Negative for chills and fever.  HENT:  Negative for ear pain and sore throat.   Eyes:  Negative for pain and visual disturbance.  Respiratory:  Negative for cough and shortness of breath.   Cardiovascular:  Negative for chest pain and palpitations.  Gastrointestinal:  Negative for abdominal pain and vomiting.  Genitourinary:  Positive for dysuria. Negative for flank pain and hematuria.  Musculoskeletal:  Negative for arthralgias and back pain.  Skin:  Negative for color change and rash.  Neurological:  Negative for seizures and syncope.  All other systems reviewed and are negative.   Physical Exam Triage Vital Signs ED Triage Vitals  Enc Vitals Group     BP 05/27/21 1321 (!) 146/84     Pulse Rate 05/27/21 1321 79     Resp 05/27/21 1321 20     Temp 05/27/21 1321 98.3 F (36.8 C)     Temp Source 05/27/21 1321 Oral     SpO2 05/27/21 1321 98 %     Weight --      Height --      Head Circumference --      Peak Flow --      Pain Score 05/27/21 1317 3     Pain Loc --      Pain Edu? --      Excl. in [place]? --    No data  found.  Updated Vital Signs BP (!) 146/84 (BP Location: Left Arm) Comment (BP Location): large cuff   Pulse 79    Temp 98.3 F (36.8 C) (Oral)    Resp 20    SpO2 98%   Visual Acuity Right Eye  Distance:   Left Eye Distance:   Bilateral Distance:    Right Eye Near:   Left Eye Near:    Bilateral Near:     Physical Exam Vitals and nursing note reviewed.  Constitutional:      General: She is not in acute distress.    Appearance: She is well-developed.  HENT:     Head: Normocephalic and atraumatic.  Eyes:     Conjunctiva/sclera: Conjunctivae normal.  Cardiovascular:     Rate and Rhythm: Normal rate and regular rhythm.     Heart sounds: No murmur heard. Pulmonary:     Effort: Pulmonary effort is normal. No respiratory distress.     Breath sounds: Normal breath sounds.  Abdominal:     Palpations: Abdomen is soft.     Tenderness: There is no abdominal tenderness.  Musculoskeletal:        General: No swelling.     Cervical back: Neck supple.  Skin:    General: Skin is warm and dry.     Capillary Refill: Capillary refill takes less than 2 seconds.  Neurological:     Mental Status: She is alert.  Psychiatric:        Mood and Affect: Mood normal.     UC Treatments / Results  Labs (all labs ordered are listed, but only abnormal results are displayed) Labs Reviewed  POCT URINALYSIS DIPSTICK, ED / UC    EKG   Radiology No results found.  Procedures Procedures (including critical care time)  Medications Ordered in UC Medications - No data to display  Initial Impression / Assessment and Plan / UC Course  I have reviewed the triage vital signs and the nursing notes.  Pertinent labs & imaging results that were available during my care of the patient were reviewed by me and considered in my medical decision making (see chart for details).     Pt unable to  give a urine sample in clinic today.  She will bring back the urine sample later today.   Final Clinical  Impressions(s) / UC Diagnoses   Final diagnoses:  Dysuria     Discharge Instructions      Please bring your urine back today for further evaluation   ED Prescriptions   None    PDMP not reviewed this encounter.   Ward, Lenise Arena, PA-C 05/27/21 1409

## 2021-05-27 NOTE — ED Triage Notes (Addendum)
No abdominal pain.  Patient has back pain , but not sure if this is chronic pain or new pain.  Patient has burning with urination at the beginning of the week.  Burning was intermittent.  Patient has frequent urination, pressure and urgency.  Yesterday morning and this morning, patient noticed urine was very dark, cloudy and strong odor.     Complex history of c-diff and fecal transplant

## 2021-05-27 NOTE — Discharge Instructions (Signed)
Please bring your urine back today for further evaluation

## 2021-05-27 NOTE — ED Notes (Signed)
Discharged by other.  Patient returned with urine specimen

## 2021-05-29 ENCOUNTER — Telehealth (HOSPITAL_COMMUNITY): Payer: Self-pay | Admitting: Emergency Medicine

## 2021-05-29 LAB — URINE CULTURE: Culture: >=100000 — AB

## 2021-05-29 MED ORDER — SULFAMETHOXAZOLE-TRIMETHOPRIM 800-160 MG PO TABS: 1.0000 | ORAL_TABLET | Freq: Two times a day (BID) | ORAL | 0 refills | Status: AC

## 2021-07-09 ENCOUNTER — Telehealth: Payer: Self-pay | Admitting: Internal Medicine

## 2021-07-09 NOTE — Telephone Encounter (Signed)
Patient called to get clarification on the RIVAROXABAN (XARELTO) VTE STARTER PACK (15 & 20 MG) ? ?Patient states that she believes she is supposed to lower the dosage but is not sure to what. She would like clarification before going to get refill on prescription  ? ? ? ? ? ?Good callback number is [phone] (M ? ? ? ? ?Please advise  ?

## 2021-07-09 NOTE — Telephone Encounter (Signed)
Will start with Xarelto 15 mg twice daily for 21 days and then transitioning to 20 mg daily.per last office note.  She would like to stop the Xarelto.  She will call Dr Juanetta Gosling office.  Patient will call back if needed. ?

## 2021-08-06 ENCOUNTER — Other Ambulatory Visit: Payer: Self-pay | Admitting: Internal Medicine

## 2021-08-06 DIAGNOSIS — I8000 Phlebitis and thrombophlebitis of superficial vessels of unspecified lower extremity: Secondary | ICD-10-CM

## 2021-08-07 ENCOUNTER — Telehealth: Payer: Self-pay | Admitting: Internal Medicine

## 2021-08-07 NOTE — Telephone Encounter (Signed)
Left message on the machine with Dr. Hardie Shackleton recommendation. Per patient request.  ? ?

## 2021-08-07 NOTE — Telephone Encounter (Signed)
Pt is finishing her  XARELTO 20 MG TABS tablet   and is seeking advice on her tapering off and if she needs to restart. Pt states you can leave a message if you don't reach her ?

## 2021-08-07 NOTE — Telephone Encounter (Signed)
Left message for patient to call back  

## 2022-01-24 ENCOUNTER — Encounter (INDEPENDENT_AMBULATORY_CARE_PROVIDER_SITE_OTHER): Payer: Medicare Other | Admitting: Ophthalmology

## 2022-01-24 ENCOUNTER — Encounter (INDEPENDENT_AMBULATORY_CARE_PROVIDER_SITE_OTHER): Payer: Self-pay

## 2022-02-06 ENCOUNTER — Other Ambulatory Visit: Payer: Self-pay | Admitting: Internal Medicine

## 2022-02-06 DIAGNOSIS — Z1231 Encounter for screening mammogram for malignant neoplasm of breast: Secondary | ICD-10-CM

## 2022-04-10 ENCOUNTER — Ambulatory Visit
Admission: RE | Admit: 2022-04-10 | Discharge: 2022-04-10 | Disposition: A | Discharge status: Home / Self Care | Payer: Medicare Other | Source: Ambulatory Visit | Attending: Internal Medicine | Admitting: Internal Medicine

## 2022-04-10 DIAGNOSIS — Z1231 Encounter for screening mammogram for malignant neoplasm of breast: Secondary | ICD-10-CM

## 2022-05-15 ENCOUNTER — Ambulatory Visit (INDEPENDENT_AMBULATORY_CARE_PROVIDER_SITE_OTHER): Payer: Medicare Other

## 2022-05-15 VITALS — BP 120/60 | HR 77 | Temp 97.8°F | Ht 63.0 in | Wt 185.0 lb

## 2022-05-15 DIAGNOSIS — Z0000 Encounter for general adult medical examination without abnormal findings: Secondary | ICD-10-CM

## 2022-05-15 NOTE — Progress Notes (Signed)
Subjective:   Monica Lamb is a 82 y.o. female who presents for Medicare Annual (Subsequent) preventive examination.  Review of Systems     Cardiac Risk Factors include: advanced age (>46mn, >>92women)     Objective:    Today's Vitals   05/15/22 1242  BP: 120/60  Pulse: 77  Temp: 97.8 F (36.6 C)  TempSrc: Oral  SpO2: 99%  Weight: 185 lb (83.9 kg)  Height: 5' 3"$  (1.6 m)   Body mass index is 32.77 kg/m.     05/15/2022   12:54 PM 05/10/2021    1:57 PM 06/25/2019    6:11 PM 02/10/2018    2:29 PM 02/06/2017    2:38 PM 02/06/2017    2:35 PM 08/06/2016   10:41 AM  Advanced Directives  Does Patient Have a Medical Advance Directive? Yes Yes Yes Yes Yes Yes Yes  Type of AParamedicof ADickinsonLiving will HEl [place]Living will     Living will;Healthcare Power of Attorney  Does patient want to make changes to medical advance directive?  No - Patient declined No - Patient declined      Copy of [place] Chart? No - copy requested No - copy requested     No - copy requested    Current Medications (verified) Outpatient Encounter Medications as of 05/15/2022  Medication Sig   acetaminophen (TYLENOL) 500 MG tablet Take 500 mg by mouth every 6 (six) hours as needed for mild pain or headache.   cholecalciferol (VITAMIN D) 1000 units tablet Take 1,000 Units by mouth daily.   metroNIDAZOLE (METROCREAM) 0.75 % cream Apply 1 application topically daily. For rosacea   Multiple Vitamins-Minerals (ICAPS AREDS 2 PO) Take by mouth 2 (two) times daily.   Phenylephrine-Acetaminophen (TYLENOL SINUS+HEADACHE PO) Take 1 tablet by mouth daily as needed (SINUS HEADACHE).   Polyethyl Glycol-Propyl Glycol (SYSTANE OP) Apply 1 drop to eye daily as needed (DRY EYES).   RIVAROXABAN (XARELTO) VTE STARTER PACK (15 & 20 MG) Follow package directions: Take one 151mtablet by mouth twice a day. On day 22, switch to one 206mablet once a day.  Take with food.   XARELTO 20 MG TABS tablet TAKE 1 TABLET BY MOUTH DAILY WITH SUPPER.   No facility-administered encounter medications on file as of 05/15/2022.    Allergies (verified) Clindamycin/lincomycin, Other, and Penicillins   History: Past Medical History:  Diagnosis Date   Allergy    Atherosclerosis of aorta (HCCDale1/20/2019   C. 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 CasMarina Gravelmortely; sees Dr. StoKathrin PennerSolitary pulmonary nodule 02/06/2016   -solitary pulm nodule, incidental finding on abd CT in 2016 -she discussed with ID and me and was indecisive about whether or not to do a repeat 12 month CT scan, she ultimately decided to hold off on the order, but to call us [place] she changed her mind  -completed follow up per radiology recommendations in 2019   Past Surgical History:  Procedure Laterality Date   CATARACT EXTRACTION Bilateral 2016   Dr. 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   Procedure: FECAL TRANSPLANT;  Surgeon: Gatha Mayer, MD;  Location: [facility];  Service: Endoscopy;  Laterality: N/A;   LYMPH NODE BIOPSY     benign per patient   lymphnode biopsy     TUBAL LIGATION     Family History  Problem Relation Age of Onset   Arthritis Mother    Hypertension Mother    Macular degeneration Mother    Cancer Father    Lung cancer Father    Arthritis Sister    Hypertension Sister    Cancer Maternal Aunt    Breast cancer Maternal Aunt    Lung cancer Sister     Colon cancer Neg Hx    Colon polyps Neg Hx    Esophageal cancer Neg Hx    Stomach cancer Neg Hx    Rectal cancer Neg Hx    Social History   Socioeconomic History   Marital status: Widowed    Spouse name: Not on file   Number of children: Not on file   Years of education: Not on file   Highest education level: Professional school degree (e.g., MD, DDS, DVM, JD)  Occupational History   Not on file  Tobacco Use   Smoking status: Never   Smokeless tobacco: Never   Tobacco comments:    was around 2ndary smoke  Vaping Use   Vaping Use: Never used  Substance and Sexual Activity   Alcohol use: No   Drug use: No   Sexual activity: Not Currently  Other Topics Concern   Not on file  Social History Narrative   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[employer], 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      Home Situation: lives alone      Spiritual Beliefs: no church, spiritual side but no formal religion      Lifestyle: no regular exercise; diet is not great - has Higher education careers adviser at [employer] center   Social Determinants of Health   Financial Resource Strain: [place]  (05/15/2022)   Overall Financial Resource Strain (CARDIA)    Difficulty of Paying Living Expenses: Not hard at all  Food Insecurity: No Food Insecurity (05/15/2022)   Hunger Vital Sign    Worried About Running Out of Food in the Last Year: Never true    [place] in the Last Year: Never true  Transportation Needs: No Transportation Needs (05/15/2022)   PRAPARE - Hydrologist (Medical): No    Lack of Transportation (Non-Medical): No  Physical Activity: Unknown (05/15/2022)   Exercise Vital Sign    Days of Exercise per Week: Not on file    Minutes of Exercise per Session: 60 min  Stress: No Stress Concern Present (05/15/2022)   [place]    Feeling of Stress : Not at all  Social Connections: Moderately Integrated (05/15/2022)    Social Connection and Isolation Panel [NHANES]    Frequency of Communication with Friends and Family: More than three times a week    Frequency of Social Gatherings with Friends and Family: More than three times a week    Attends Religious Services: Never    Marine scientist or Organizations: Yes  Attends Music therapist: More than 4 times per year    Marital Status: Married    Tobacco Counseling Counseling given: Not Answered Tobacco comments: was around 2ndary smoke   Clinical Intake:  Pre-visit preparation completed: Yes  Pain : No/denies pain     BMI - recorded: 32.77 Nutritional Status: BMI > 30  Obese Nutritional Risks: None Diabetes: No  How often do you need to have someone help you when you read instructions, pamphlets, or other written materials from your doctor or pharmacy?: 1 - Never  Diabetic?  No  Interpreter Needed?: No  Information entered by :: Jessica Priest LPN   Activities of Daily Living    05/15/2022   12:50 PM  In your present state of health, do you have any difficulty performing the following activities:  Hearing? 1  Comment Pending audiology appt.  Vision? 0  Difficulty concentrating or making decisions? 0  Walking or climbing stairs? 0  Dressing or bathing? 0  Doing errands, shopping? 0  Preparing Food and eating ? N  Using the Toilet? N  In the past six months, have you accidently leaked urine? N  Do you have problems with loss of bowel control? N  Managing your Medications? N  Managing your Finances? N  Housekeeping or managing your Housekeeping? N    Patient Care Team: Isaac Bliss, Rayford Halsted, MD as PCP - General (Internal Medicine) Carlyle Basques, MD as Consulting Physician (Infectious Diseases) Azucena Fallen, MD as Consulting Physician (Obstetrics and Gynecology)  Indicate any recent Medical Services you may have received from other than [facility] providers in the past year (date may be  approximate).     Assessment:   This is a routine wellness examination for [place].  Hearing/Vision screen Hearing Screening - Comments::  hearing difficulties  Pending audiology appt Vision Screening - Comments:: Wears rx glasses - up to date with routine eye exams with  Dr Celene Squibb  Dietary issues and exercise activities discussed: Exercise limited by: None identified   Goals Addressed               This Visit's Progress     Increase physical activity (pt-stated)         Depression Screen    05/15/2022   12:49 PM 05/10/2021    1:52 PM 05/02/2021    9:46 AM 06/25/2019    6:11 PM 03/11/2019   10:25 AM 02/10/2018    2:34 PM 02/06/2017    2:42 PM  PHQ 2/9 Scores  PHQ - 2 Score 0 0 0 0 0 0 0  PHQ- 9 Score  0 0  1      Fall Risk    05/15/2022   12:52 PM 05/10/2021    1:57 PM 05/02/2021    9:46 AM 05/01/2021    4:46 PM 06/25/2019    6:10 PM  [place] in the past year? 0 0 0 0 0  Number falls in past yr: 0 0 0  0  Injury with Fall? 0 0 0    Risk for fall due to : No Fall Risks No Fall Risks     Follow up Falls prevention discussed  Falls evaluation completed      FALL RISK PREVENTION PERTAINING TO THE HOME:  Any stairs in or around the home? Yes  If so, are there any without handrails? No  Home free of loose throw rugs in walkways, pet beds, electrical cords, etc? Yes  Adequate lighting in  your home to reduce risk of falls? Yes   ASSISTIVE DEVICES UTILIZED TO PREVENT FALLS:  Life alert? No  Use of a cane, walker or w/c? No  Grab bars in the bathroom? Yes  Shower chair or bench in shower? No  Elevated toilet seat or a handicapped toilet? No   TIMED UP AND GO:  Was the test performed? Yes .  Length of time to ambulate 10 feet: 10 sec.   Gait steady and fast without use of assistive device  Cognitive Function:        05/15/2022   12:54 PM 05/10/2021    1:58 PM  6CIT Screen  What Year? 0 points 0 points  What month? 0 points 0 points  What time? 0  points 0 points  Count back from 20 0 points 0 points  Months in reverse 0 points 0 points  Repeat phrase 0 points 0 points  Total Score 0 points 0 points    Immunizations Immunization History  Administered Date(s) Administered   Fluad Quad(high Dose 65+) 02/03/2019   Influenza, High Dose Seasonal PF 02/06/2016, 02/06/2017, 02/10/2018   Influenza-Unspecified 01/30/2021, 12/06/2021   PFIZER(Purple Top)SARS-COV-2 Vaccination 05/14/2019, 06/05/2019, 08/30/2020   Pneumococcal Conjugate-13 11/27/2015   Pneumococcal Polysaccharide-23 04/29/2017   Tdap 03/09/2018    TDAP status: Up to date  Flu Vaccine status: Up to date  Pneumococcal vaccine status: Up to date  Covid-19 vaccine status: Completed vaccines  Qualifies for Shingles Vaccine? Yes   Zostavax completed No   Shingrix Completed?: No.    Education has been provided regarding the importance of this vaccine. Patient has been advised to call insurance company to determine out of pocket expense if they have not yet received this vaccine. Advised may also receive vaccine at local pharmacy or Health Dept. Verbalized acceptance and understanding.  Screening Tests Health Maintenance  Topic Date Due   COVID-19 Vaccine (4 - 2023-24 season) 05/31/2022 (Originally 11/30/2021)   Zoster Vaccines- Shingrix (1 of 2) 08/13/2022 (Originally [birth year]1992)   Hepatitis C Screening  05/16/2023 (Originally 11/05/1958)   Medicare Annual Wellness (AWV)  05/16/2023   DTaP/Tdap/Td (2 - Td or Tdap) 03/09/2028   Pneumonia Vaccine 66+ Years old  Completed   INFLUENZA VACCINE  Completed   DEXA SCAN  Completed   HPV VACCINES  Aged Out   COLONOSCOPY (Pts 45-43yr Insurance coverage will need to be confirmed)  Discontinued    Health Maintenance  There are no preventive care reminders to display for this patient.   Colorectal cancer screening: No longer required.   Mammogram status: No longer required due to Age.  Bone Density status: Completed 04/24/17.  Results reflect: Bone density results: OSTEOPOROSIS. Repeat every   years.  Lung Cancer Screening: (Low Dose CT Chest recommended if Age 80-80years, 30 pack-year currently smoking OR have quit w/in 15years.) does not qualify.     Additional Screening:  Hepatitis C Screening: does qualify; Completed Deferred  Vision Screening: Recommended annual ophthalmology exams for early detection of glaucoma and other disorders of the eye. Is the patient up to date with their annual eye exam?  Yes  Who is the provider or what is the name of the office in which the patient attends annual eye exams? Dr MCelene SquibbIf pt is not established with a provider, would they like to be referred to a provider to establish care? No .   Dental Screening: Recommended annual dental exams for proper oral hygiene  Community Resource Referral / Chronic Care Management:  CRR required this visit?  No   CCM required this visit?  No      Plan:     I have personally reviewed and noted the following in the patient's chart:   Medical and social history Use of alcohol, tobacco or illicit drugs  Current medications and supplements including opioid prescriptions. Patient is not currently taking opioid prescriptions. Functional ability and status Nutritional status Physical activity Advanced directives List of other physicians Hospitalizations, surgeries, and ER visits in previous 12 months Vitals Screenings to include cognitive, depression, and falls Referrals and appointments  In addition, I have reviewed and discussed with patient certain preventive protocols, quality metrics, and best practice recommendations. A written personalized care plan for preventive services as well as general preventive health recommendations were provided to patient.     Criselda Peaches, LPN   [id number]   Nurse Notes: Patient due Hep-C Screening

## 2022-05-15 NOTE — Patient Instructions (Addendum)
Monica Lamb , Thank you for taking time to come for your Medicare Wellness Visit. I appreciate your ongoing commitment to your health goals. Please review the following plan we discussed and let me know if I can assist you in the future.   These are the goals we discussed:  Goals       Increase physical activity (pt-stated)      patient      Start to plan trips;  [facility]       [facility]      [facility]      Patient Stated      Goal will be to go to [place]         This is a list of the screening recommended for you and due dates:  Health Maintenance  Topic Date Due   COVID-19 Vaccine (4 - 2023-24 season) 05/31/2022*   Zoster (Shingles) Vaccine (1 of 2) 08/13/2022*   Hepatitis C Screening: USPSTF Recommendation to screen - Ages 18-79 yo.  05/16/2023*   Medicare Annual Wellness Visit  05/16/2023   DTaP/Tdap/Td vaccine (2 - Td or Tdap) 03/09/2028   Pneumonia Vaccine  Completed   Flu Shot  Completed   DEXA scan (bone density measurement)  Completed   HPV Vaccine  Aged Out   Colon Cancer Screening  Discontinued  *Topic was postponed. The date shown is not the original due date.    Advanced directives:Please bring a copy of your health care power of attorney and living will to the office to be added to your chart at your convenience.   Conditions/risks identified: None  Next appointment: Follow up in one year for your annual wellness visit     Preventive Care 65 Years and Older, Female Preventive care refers to lifestyle choices and visits with your health care provider that can promote health and wellness. What does preventive care include? A yearly physical exam. This is also called an annual well check. Dental exams once or twice a year. Routine eye exams. Ask your health care provider how often you should have your eyes checked. Personal lifestyle choices, including: Daily care of your teeth and  gums. Regular physical activity. Eating a healthy diet. Avoiding tobacco and drug use. Limiting alcohol use. Practicing safe sex. Taking low-dose aspirin every day. Taking vitamin and mineral supplements as recommended by your health care provider. What happens during an annual well check? The services and screenings done by your health care provider during your annual well check will depend on your age, overall health, lifestyle risk factors, and family history of disease. Counseling  Your health care provider may ask you questions about your: Alcohol use. Tobacco use. Drug use. Emotional well-being. Home and relationship well-being. Sexual activity. Eating habits. History of falls. Memory and ability to understand (cognition). Work and work Statistician. Reproductive health. Screening  You may have the following tests or measurements: Height, weight, and BMI. Blood pressure. Lipid and cholesterol levels. These may be checked every 5 years, or more frequently if you are over 19 years old. Skin check. Lung cancer screening. You may have this screening every year starting at age 24 if you have a 30-pack-year history of smoking and currently smoke or have quit within the past 15 years. Fecal occult blood test (FOBT) of the stool. You may have this test every year starting at age 39. Flexible sigmoidoscopy or colonoscopy. You may have a  sigmoidoscopy every 5 years or a colonoscopy every 10 years starting at age 33. Hepatitis C blood test. Hepatitis B blood test. Sexually transmitted disease (STD) testing. Diabetes screening. This is done by checking your blood sugar (glucose) after you have not eaten for a while (fasting). You may have this done every 1-3 years. Bone density scan. This is done to screen for osteoporosis. You may have this done starting at age 67. Mammogram. This may be done every 1-2 years. Talk to your health care provider about how often you should have regular  mammograms. Talk with your health care provider about your test results, treatment options, and if necessary, the need for more tests. Vaccines  Your health care provider may recommend certain vaccines, such as: Influenza vaccine. This is recommended every year. Tetanus, diphtheria, and acellular pertussis (Tdap, Td) vaccine. You may need a Td booster every 10 years. Zoster vaccine. You may need this after age 28. Pneumococcal 13-valent conjugate (PCV13) vaccine. One dose is recommended after age 24. Pneumococcal polysaccharide (PPSV23) vaccine. One dose is recommended after age 10. Talk to your health care provider about which screenings and vaccines you need and how often you need them. This information is not intended to replace advice given to you by your health care provider. Make sure you discuss any questions you have with your health care provider. Document Released: 04/14/2015 Document Revised: 12/06/2015 Document Reviewed: 01/17/2015 Elsevier Interactive Patient Education  2017 [place] Prevention in the Home Falls can cause injuries. They can happen to people of all ages. There are many things you can do to make your home safe and to help prevent falls. What can I do on the outside of my home? Regularly fix the edges of walkways and driveways and fix any cracks. Remove anything that might make you trip as you walk through a door, such as a raised step or threshold. Trim any bushes or trees on the path to your home. Use bright outdoor lighting. Clear any walking paths of anything that might make someone trip, such as rocks or tools. Regularly check to see if handrails are loose or broken. Make sure that both sides of any steps have handrails. Any raised decks and porches should have guardrails on the edges. Have any leaves, snow, or ice cleared regularly. Use sand or salt on walking paths during winter. Clean up any spills in your garage right away. This includes oil  or grease spills. What can I do in the bathroom? Use night lights. Install grab bars by the toilet and in the tub and shower. Do not use towel bars as grab bars. Use non-skid mats or decals in the tub or shower. If you need to sit down in the shower, use a plastic, non-slip stool. Keep the floor dry. Clean up any water that spills on the floor as soon as it happens. Remove soap buildup in the tub or shower regularly. Attach bath mats securely with double-sided non-slip rug tape. Do not have throw rugs and other things on the floor that can make you trip. What can I do in the bedroom? Use night lights. Make sure that you have a light by your bed that is easy to reach. Do not use any sheets or blankets that are too big for your bed. They should not hang down onto the floor. Have a firm chair that has side arms. You can use this for support while you get dressed. Do not have throw rugs and other  things on the floor that can make you trip. What can I do in the kitchen? Clean up any spills right away. Avoid walking on wet floors. Keep items that you use a lot in easy-to-reach places. If you need to reach something above you, use a strong step stool that has a grab bar. Keep electrical cords out of the way. Do not use floor polish or wax that makes floors slippery. If you must use wax, use non-skid floor wax. Do not have throw rugs and other things on the floor that can make you trip. What can I do with my stairs? Do not leave any items on the stairs. Make sure that there are handrails on both sides of the stairs and use them. Fix handrails that are broken or loose. Make sure that handrails are as long as the stairways. Check any carpeting to make sure that it is firmly attached to the stairs. Fix any carpet that is loose or worn. Avoid having throw rugs at the top or bottom of the stairs. If you do have throw rugs, attach them to the floor with carpet tape. Make sure that you have a light  switch at the top of the stairs and the bottom of the stairs. If you do not have them, ask someone to add them for you. What else can I do to help prevent falls? Wear shoes that: Do not have high heels. Have rubber bottoms. Are comfortable and fit you well. Are closed at the toe. Do not wear sandals. If you use a stepladder: Make sure that it is fully opened. Do not climb a closed stepladder. Make sure that both sides of the stepladder are locked into place. Ask someone to hold it for you, if possible. Clearly mark and make sure that you can see: Any grab bars or handrails. First and last steps. Where the edge of each step is. Use tools that help you move around (mobility aids) if they are needed. These include: Canes. Walkers. Scooters. Crutches. Turn on the lights when you go into a dark area. Replace any light bulbs as soon as they burn out. Set up your furniture so you have a clear path. Avoid moving your furniture around. If any of your floors are uneven, fix them. If there are any pets around you, be aware of where they are. Review your medicines with your doctor. Some medicines can make you feel dizzy. This can increase your chance of falling. Ask your doctor what other things that you can do to help prevent falls. This information is not intended to replace advice given to you by your health care provider. Make sure you discuss any questions you have with your health care provider. Document Released: 01/12/2009 Document Revised: 08/24/2015 Document Reviewed: 04/22/2014 Elsevier Interactive Patient Education  2017 [employer].

## 2022-05-28 IMAGING — MG MM DIGITAL SCREENING BILAT W/ TOMO AND CAD
8 series · 8 of 24 positions shown · non-contrast
Comparison: Previous exam(s).

CLINICAL DATA: Screening.

EXAM:
DIGITAL SCREENING BILATERAL MAMMOGRAM WITH TOMOSYNTHESIS AND CAD
TECHNIQUE: Bilateral screening digital craniocaudal and mediolateral oblique
mammograms were obtained. Bilateral screening digital breast
tomosynthesis was performed. The images were evaluated with
computer-aided detection.

[L MLO synth-2D]
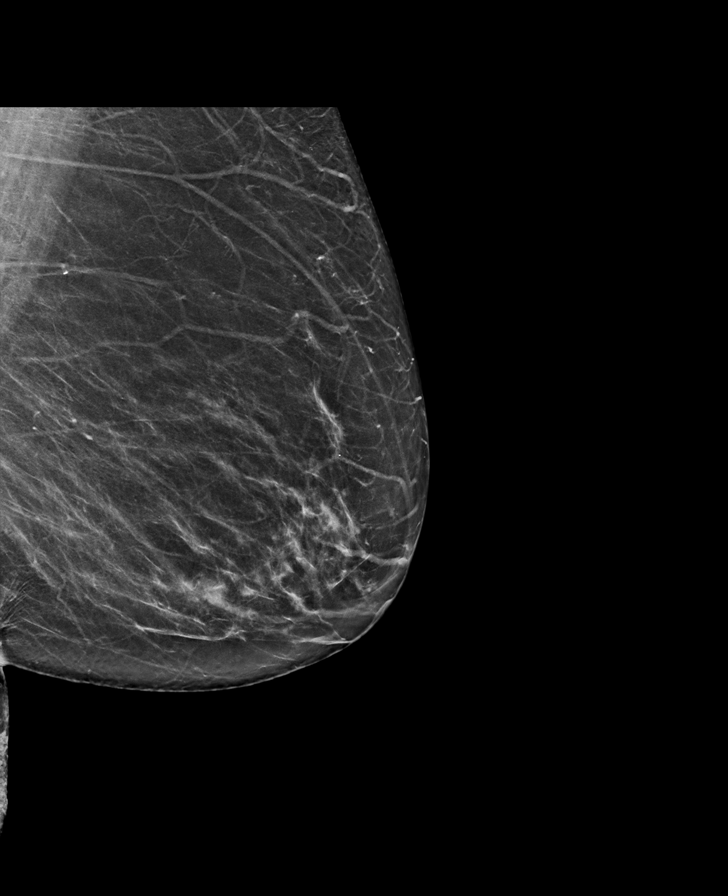

[R CC synth-2D]
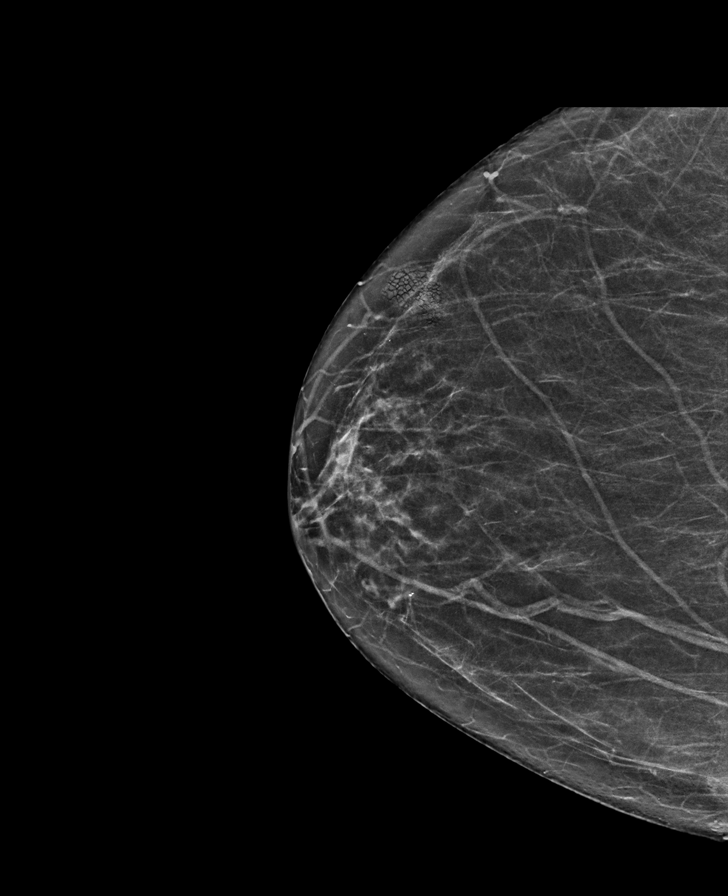

[L CC synth-2D]
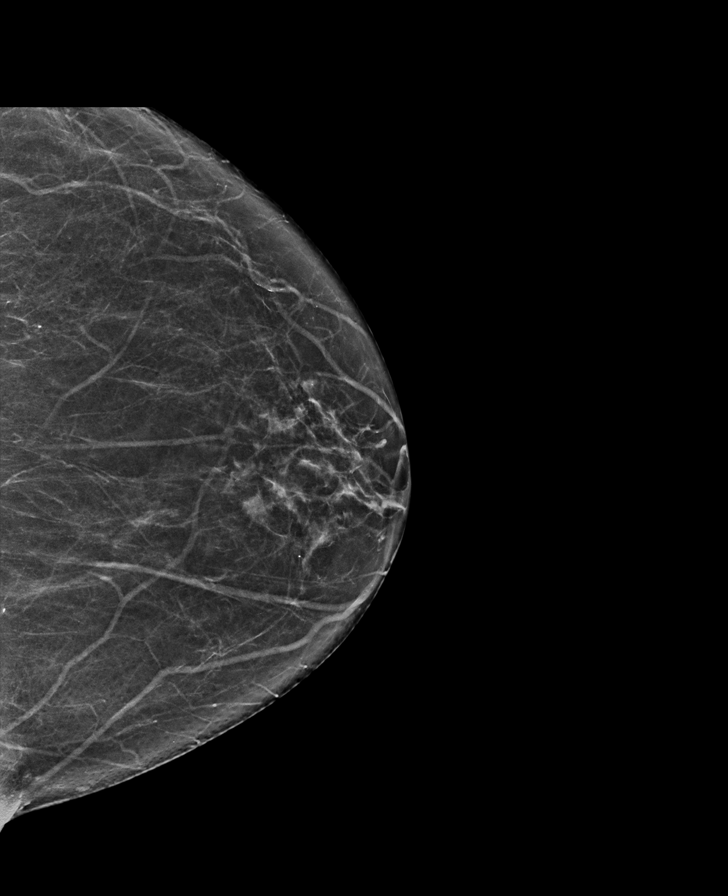

[R MLO synth-2D]
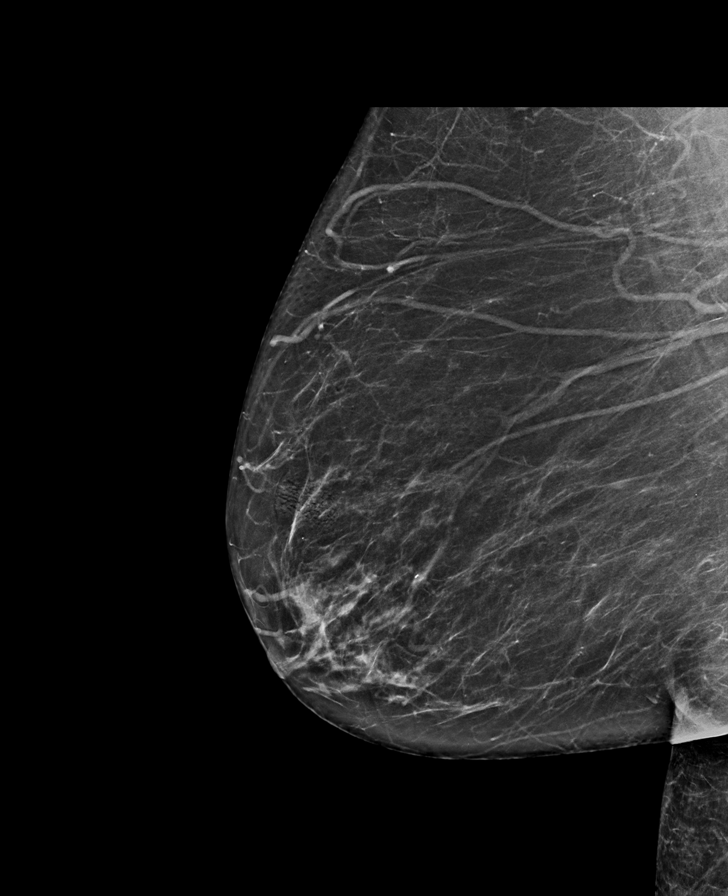

[R MLO tomo · tomo slice 38/75.0]
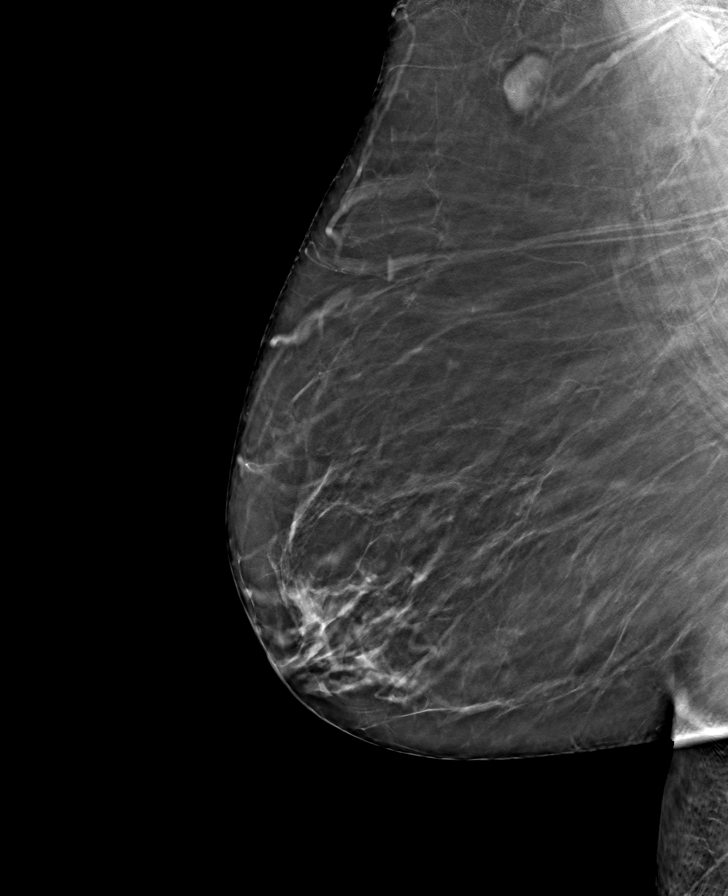

[R CC tomo · tomo slice 31/62.0]
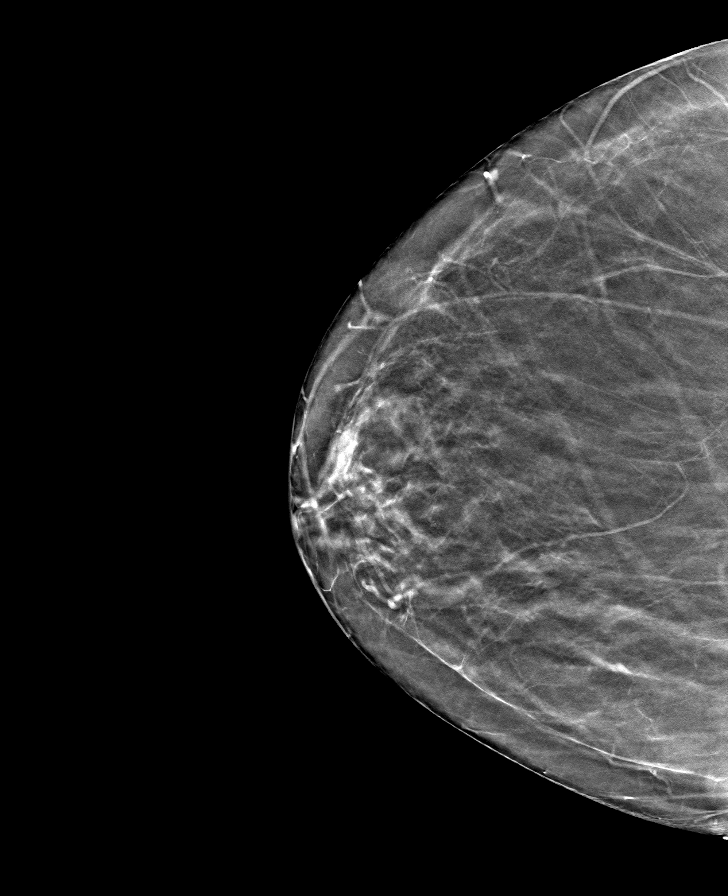

[L MLO tomo · tomo slice 39/76.0]
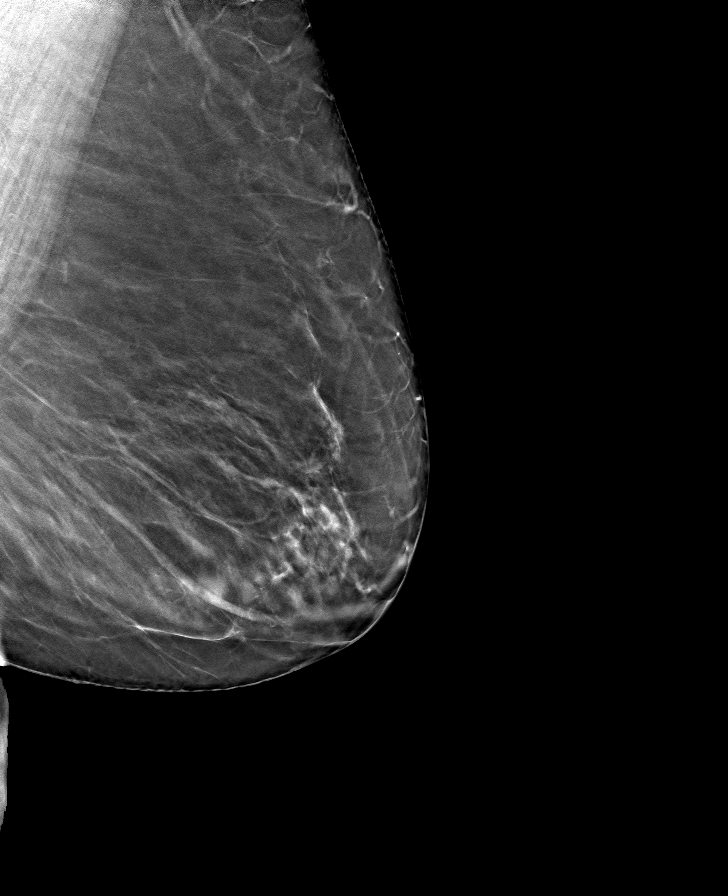

[L CC tomo · tomo slice 33/66.0]
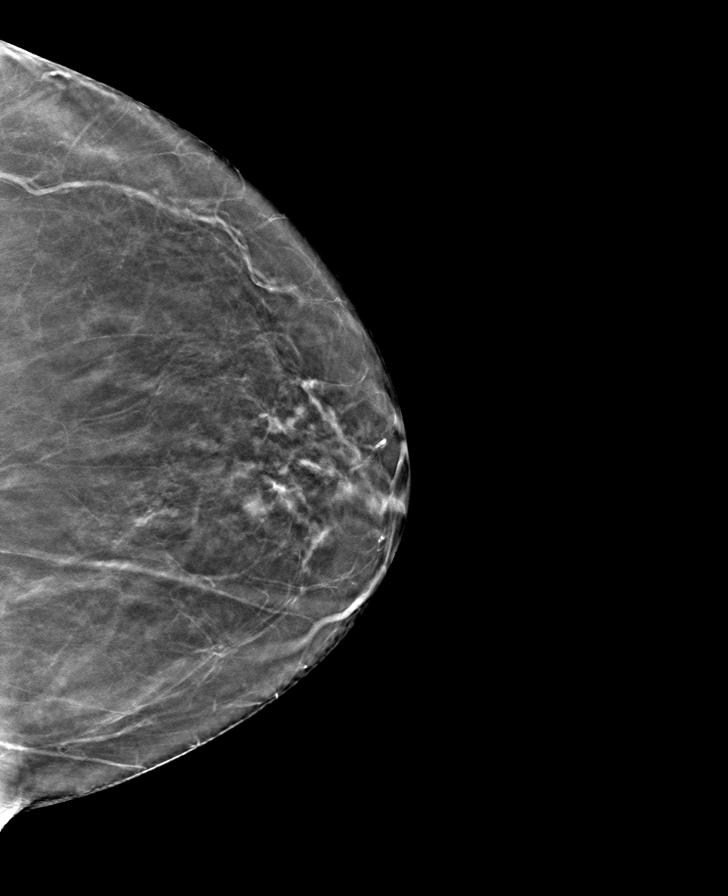

[8 of 24 positions shown; findings below may reference images not displayed]

ACR Breast Density Category b: There are scattered areas of
fibroglandular density.
FINDINGS: There are no findings suspicious for malignancy.
IMPRESSION: No mammographic evidence of malignancy. A result letter of this
screening mammogram will be mailed directly to the patient.

RECOMMENDATION:
Screening mammogram in one year. (Code:51-O-LD2)

BI-RADS CATEGORY  1: Negative.

## 2022-11-03 ENCOUNTER — Emergency Department (HOSPITAL_COMMUNITY)
Admission: EM | Admit: 2022-11-03 | Discharge: 2022-11-03 | Disposition: A | Discharge status: Home / Self Care | Payer: Medicare Other | Source: Home / Self Care | Attending: Emergency Medicine | Admitting: Emergency Medicine

## 2022-11-03 ENCOUNTER — Encounter (HOSPITAL_COMMUNITY): Payer: Self-pay

## 2022-11-03 ENCOUNTER — Ambulatory Visit (HOSPITAL_COMMUNITY)
Admission: EM | Admit: 2022-11-03 | Discharge: 2022-11-03 | Disposition: A | Discharge status: Home / Self Care | Payer: Medicare Other

## 2022-11-03 ENCOUNTER — Emergency Department (HOSPITAL_BASED_OUTPATIENT_CLINIC_OR_DEPARTMENT_OTHER): Payer: Medicare Other

## 2022-11-03 ENCOUNTER — Other Ambulatory Visit: Payer: Self-pay

## 2022-11-03 DIAGNOSIS — M79661 Pain in right lower leg: Secondary | ICD-10-CM

## 2022-11-03 DIAGNOSIS — I82890 Acute embolism and thrombosis of other specified veins: Secondary | ICD-10-CM

## 2022-11-03 DIAGNOSIS — I82811 Embolism and thrombosis of superficial veins of right lower extremities: Secondary | ICD-10-CM | POA: Diagnosis not present

## 2022-11-03 DIAGNOSIS — M79604 Pain in right leg: Secondary | ICD-10-CM

## 2022-11-03 DIAGNOSIS — Z7901 Long term (current) use of anticoagulants: Secondary | ICD-10-CM | POA: Diagnosis not present

## 2022-11-03 DIAGNOSIS — Z86718 Personal history of other venous thrombosis and embolism: Secondary | ICD-10-CM

## 2022-11-03 HISTORY — DX: Acute embolism and thrombosis of unspecified deep veins of unspecified lower extremity: I82.409

## 2022-11-03 LAB — COMPREHENSIVE METABOLIC PANEL
ALT: 12 U/L (ref 0–44)
AST: 18 U/L (ref 15–41)
Albumin: 3.4 g/dL — ABNORMAL LOW (ref 3.5–5.0)
Alkaline Phosphatase: 69 U/L (ref 38–126)
Anion gap: 9 (ref 5–15)
BUN: 13 mg/dL (ref 8–23)
CO2: 25 mmol/L (ref 22–32)
Calcium: 8.6 mg/dL — ABNORMAL LOW (ref 8.9–10.3)
Chloride: 102 mmol/L (ref 98–111)
Creatinine, Ser: 0.73 mg/dL (ref 0.44–1.00)
GFR, Estimated: >60 mL/min (ref >60)
Glucose, Bld: 97 mg/dL (ref 70–99)
Potassium: 3.6 mmol/L (ref 3.5–5.1)
Sodium: 136 mmol/L (ref 135–145)
Total Bilirubin: 0.7 mg/dL (ref 0.3–1.2)
Total Protein: 6.9 g/dL (ref 6.5–8.1)

## 2022-11-03 LAB — CBC WITH DIFFERENTIAL/PLATELET
Abs Immature Granulocytes: 0.02 10*3/uL (ref 0.00–0.07)
Basophils Absolute: 0.1 10*3/uL (ref 0.0–0.1)
Basophils Relative: 1 %
Eosinophils Absolute: 0.2 10*3/uL (ref 0.0–0.5)
Eosinophils Relative: 2 %
HCT: 38.8 % (ref 36.0–46.0)
Hemoglobin: 12.6 g/dL (ref 12.0–15.0)
Immature Granulocytes: 0 %
Lymphocytes Relative: 16 %
Lymphs Abs: 1.6 10*3/uL (ref 0.7–4.0)
MCH: 28.6 pg (ref 26.0–34.0)
MCHC: 32.5 g/dL (ref 30.0–36.0)
MCV: 88 fL (ref 80.0–100.0)
Monocytes Absolute: 0.7 10*3/uL (ref 0.1–1.0)
Monocytes Relative: 7 %
Neutro Abs: 7.5 10*3/uL (ref 1.7–7.7)
Neutrophils Relative %: 74 %
Platelets: 260 10*3/uL (ref 150–400)
RBC: 4.41 MIL/uL (ref 3.87–5.11)
RDW: 13.4 % (ref 11.5–15.5)
WBC: 10 10*3/uL (ref 4.0–10.5)
nRBC: 0 % (ref 0.0–0.2)

## 2022-11-03 MED ORDER — APIXABAN (ELIQUIS) VTE STARTER PACK (10MG AND 5MG): ORAL_TABLET | ORAL | 0 refills | Status: DC

## 2022-11-03 NOTE — ED Notes (Signed)
NS paged vascular US

## 2022-11-03 NOTE — ED Provider Notes (Signed)
[facility] AT [facility] Provider Note   CSN: [id number] Arrival date & time: 11/03/22  1552     History  Chief Complaint  Patient presents with   Rt Leg Pain    SACHI BOULAY is a 82 y.o. female.  The history is provided by the patient and medical records. No language interpreter was used.  Leg Pain Location:  Leg Time since incident:  1 week Injury: no   Leg location:  R lower leg Pain details:    Quality:  Aching   Radiates to:  Does not radiate   Severity:  Moderate   Onset quality:  Gradual Foreign body present:  No foreign bodies Prior injury to area:  Yes Relieved by:  Nothing Worsened by:  Nothing Ineffective treatments:  None tried Associated symptoms: swelling   Associated symptoms: no back pain, no fatigue, no fever, no neck pain and no numbness        Home Medications Prior to Admission medications   Medication Sig Start Date End Date Taking? Authorizing Provider  acetaminophen (TYLENOL) 500 MG tablet Take 500 mg by mouth every 6 (six) hours as needed for mild pain or headache.   Yes [provider]  cholecalciferol (VITAMIN D) 1000 units tablet Take 1,000 Units by mouth daily.   Yes [provider]  Ibuprofen-Acetaminophen (ADVIL DUAL ACTION PO) Take 1 tablet by mouth every 6 (six) hours as needed.   Yes [provider]  Multiple Vitamins-Minerals (ICAPS AREDS 2 PO) Take by mouth 3 (three) times a week.   Yes [provider]  Polyethyl Glycol-Propyl Glycol (SYSTANE OP) Apply 1 drop to eye daily as needed (DRY EYES).   Yes [provider]  Probiotic Product (PROBIOTIC BLEND PO) Take 1 tablet by mouth daily at 2 PM.   Yes [provider]  XARELTO 20 MG TABS tablet TAKE 1 TABLET BY MOUTH DAILY WITH SUPPER. Patient not taking: Reported on 11/03/2022 08/06/21   Philip Aspen, Limmie Patricia, MD      Allergies    Clindamycin/lincomycin, Other, and Penicillins    Review of  Systems   Review of Systems  Constitutional:  Negative for chills, fatigue and fever.  HENT:  Negative for congestion.   Respiratory:  Negative for cough, chest tightness, shortness of breath and wheezing.   Cardiovascular:  Positive for leg swelling. Negative for chest pain and palpitations.  Gastrointestinal:  Negative for abdominal pain, constipation, diarrhea, nausea and vomiting.  Genitourinary:  Negative for dysuria.  Musculoskeletal:  Negative for back pain, neck pain and neck stiffness.  Skin:  Negative for rash and wound.  Neurological:  Negative for headaches.  Psychiatric/Behavioral:  Negative for agitation and confusion.   All other systems reviewed and are negative.   Physical Exam Updated Vital Signs BP (!) 149/75 (BP Location: Right Arm)   Pulse 84   Temp 98.3 F (36.8 C) (Oral)   Resp 18   SpO2 98%  Physical Exam Vitals and nursing note reviewed.  Constitutional:      General: She is not in acute distress.    Appearance: She is well-developed. She is not ill-appearing, toxic-appearing or diaphoretic.  HENT:     Head: Normocephalic and atraumatic.     Nose: No congestion or rhinorrhea.     Mouth/Throat:     Mouth: Mucous membranes are moist.     Pharynx: No oropharyngeal exudate or posterior oropharyngeal erythema.  Eyes:     Extraocular Movements: Extraocular movements  intact.     Conjunctiva/sclera: Conjunctivae normal.     Pupils: Pupils are equal, round, and reactive to light.  Cardiovascular:     Rate and Rhythm: Normal rate and regular rhythm.     Heart sounds: No murmur heard. Pulmonary:     Effort: Pulmonary effort is normal. No respiratory distress.     Breath sounds: Normal breath sounds. No wheezing, rhonchi or rales.  Chest:     Chest wall: No tenderness.  Abdominal:     General: Abdomen is flat.     Palpations: Abdomen is soft.     Tenderness: There is no abdominal tenderness. There is no right CVA tenderness, left CVA tenderness, guarding  or rebound.  Musculoskeletal:        General: Swelling and tenderness present. No signs of injury.     Cervical back: Neck supple.     Comments: Intact pulses in extremities.  Tenderness swelling and erythema to right calf.  Skin:    General: Skin is warm and dry.     Capillary Refill: Capillary refill takes less than 2 seconds.     Findings: Erythema present. No rash.  Neurological:     General: No focal deficit present.     Mental Status: She is alert.     Sensory: No sensory deficit.     Motor: No weakness.  Psychiatric:        Mood and Affect: Mood normal.     ED Results / Procedures / Treatments   Labs (all labs ordered are listed, but only abnormal results are displayed) Labs Reviewed  COMPREHENSIVE METABOLIC PANEL - Abnormal; Notable for the following components:      Result Value   Calcium 8.6 (*)    Albumin 3.4 (*)    All other components within normal limits  CBC WITH DIFFERENTIAL/PLATELET    EKG None  Radiology VAS [place] LOWER EXTREMITY VENOUS (DVT) (ONLY [facility])  Result Date: 11/03/2022  Lower Venous DVT Study Patient Name:  GRACEMARIE SKEET  Date of Exam:   11/03/2022 Medical Rec #: [id number]          Accession #:    [id number] Date of Birth: 03/18/1941           Patient Gender: F Patient Age:   63 years Exam Location:  [facility] Procedure:      VAS [place] LOWER EXTREMITY VENOUS (DVT) Referring Phys: Lynden Oxford --------------------------------------------------------------------------------  Indications: Pain, Swelling, and Erythema.  Risk Factors: Recent extended travel. Limitations: Poor ultrasound/tissue interface. Comparison Study: Previous exam on 05/11/21 was negative for DVT and positive for                   SVT in GSV and varicose veins of calf. Performing Technologist: Ernestene Mention RVT, RDMS  Examination Guidelines: A complete evaluation includes B-mode imaging, spectral Doppler, color Doppler, and power Doppler as needed of all accessible portions of  each vessel. Bilateral testing is considered an integral part of a complete examination. Limited examinations for reoccurring indications may be performed as noted. The reflux portion of the exam is performed with the patient in reverse Trendelenburg.  +---------+---------------+---------+-----------+----------+--------------+ RIGHT    CompressibilityPhasicitySpontaneityPropertiesThrombus Aging +---------+---------------+---------+-----------+----------+--------------+ CFV      Full           Yes      Yes                                 +---------+---------------+---------+-----------+----------+--------------+  SFJ      Full                                                        +---------+---------------+---------+-----------+----------+--------------+ FV Prox  Full           Yes      Yes                                 +---------+---------------+---------+-----------+----------+--------------+ FV Mid   Full           Yes      Yes                                 +---------+---------------+---------+-----------+----------+--------------+ FV DistalFull           Yes      Yes                                 +---------+---------------+---------+-----------+----------+--------------+ PFV      Full                                                        +---------+---------------+---------+-----------+----------+--------------+ POP      Full           Yes      Yes                                 +---------+---------------+---------+-----------+----------+--------------+ PTV      Full                                                        +---------+---------------+---------+-----------+----------+--------------+ PERO                                                  not visualized +---------+---------------+---------+-----------+----------+--------------+ GSV      None           No       No                   Acute           +---------+---------------+---------+-----------+----------+--------------+ VV       None           No       No         dilated   Acute          +---------+---------------+---------+-----------+----------+--------------+ SVT if GSV extending from distal calf to ankle. Multiple thrombosed and dilated varicose veins seen extending from knee to distal calf.    Summary: RIGHT: - Findings consistent with acute superficial vein thrombosis involving the  right great saphenous vein, and right varicosities or other superficial veins. - There is no evidence of deep vein thrombosis in the lower extremity.  - No cystic structure found in the popliteal fossa.  LEFT: - No evidence of common femoral vein obstruction.  *See table(s) above for measurements and observations.    Preliminary     Procedures Procedures    Medications Ordered in ED Medications - No data to display  ED Course/ Medical Decision Making/ A&P                                 Medical Decision Making Amount and/or Complexity of Data Reviewed Labs: ordered.  Risk Prescription drug management.    KENZIE THORESON is a 82 y.o. female with a past medical history significant for previous C. 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  According to patient, she bumped her right lower calf on something about a week and a half ago and had a slight mark there.  She reports of no large laceration.  She reports that she then drove to [place] and noted swelling and pain to her right calf.  She says that she then drove back home yesterday and the pain is now worsened and swelling is worse.  She is concerned about blood clot.  She reports that he is very sensitive to antibiotics and is more concerned about clot than infection.  She denies any fevers, chills, congestion, cough, nausea, vomiting, constipation, diarrhea, or urinary changes.  She reports no chest  pain or shortness of breath.  On exam, patient does have redness swelling and tenderness to her right calf but has intact pulses, strength and sensation distally in both feet.  No tenderness of the knee or medial thigh.  No tenderness of the hips.  Lungs clear.  Chest nontender.  Abdomen nontender.  Patient otherwise resting.  Patient had some screening labs in triage that did not show concerning findings and then she had an ultrasound that shows evidence of superficial vein thrombosis and thrombosis of large varicose veins.  Due to the amount of varicose vein clot, will discuss with vascular if they feel she needs anticoagulation or not.  Anticipate disposition after discussion with vascular.  Vascular surgery called back and I spoke with Dr. Lenell Antu.  He does feel the patient would benefit from anticoagulation with Eliquis.  This is what she was on previously.  Patient will get prescription for starter pack and agrees with discharge home.  She will follow-up with Dr. Lenell Antu.  She had no other questions or concerns and was discharged in good condition.         Final Clinical Impression(s) / ED Diagnoses Final diagnoses:  Superficial vein thrombosis    Rx / DC Orders ED Discharge Orders          Ordered    APIXABAN (ELIQUIS) VTE STARTER PACK (10MG  AND 5MG )       Note to Pharmacy: If starter pack unavailable, substitute with seventy-four 5 mg apixaban tabs following the above SIG directions.   11/03/22 2219            Clinical Impression: 1. Superficial vein thrombosis     Disposition: Discharge  Condition: Good  I have discussed the results, Dx and Tx plan with the pt(& family if present). He/she/they expressed understanding and agree(s) with the plan.  Discharge instructions discussed at great length. Strict return precautions discussed and pt &/or family have verbalized understanding of the instructions. No further questions at time of discharge.    Discharge  Medication List as of 11/03/2022 10:21 PM     START taking these medications   Details  APIXABAN (ELIQUIS) VTE STARTER PACK (10MG  AND 5MG ) Take as directed on package: start with two-5mg  tablets twice daily for 7 days. On day 8, switch to one-5mg  tablet twice daily., Normal        Follow Up: Leonie Douglas, MD [address] [place] [address] [phone]     [facility] Emergency Department at [facility] [address] [place] [address] [phone]        Juleah Paradise, Canary Brim, MD 11/03/22 [phone]

## 2022-11-03 NOTE — ED Notes (Signed)
Vascular will come get pt as soon as possible. The department is tied up at this time.

## 2022-11-03 NOTE — Progress Notes (Signed)
RLE venous duplex has been completed.  Preliminary results given to Dr. Rush Landmark.    Results can be found under chart review under CV PROC. 11/03/2022 8:05 PM Decari Duggar RVT, RDMS

## 2022-11-03 NOTE — ED Triage Notes (Addendum)
Pt came in via POV d/t driving to [place] 6 days ago & drove home yesterday. She began having a tender spot in her Rt calf area when she drove down there & throughout the week it became more swollen, red, hot & the tenderness is spreading to more of the calf now. Not on thinners, Hx of DVT in the Lt leg. Denies CP or SOB. A/Ox4, rates her pain 4/10 but if its touched it is 8/10.

## 2022-11-03 NOTE — Discharge Instructions (Signed)
Your history, exam, workup today revealed you do have a superficial vein thrombosis that is long enough to warrant anticoagulation.  I spoke to your vascular team Dr. Lenell Antu he wanted you to get started on Eliquis again.  Please take the blood thinners and follow-up with him in clinic.  If any symptoms change or worsen acutely, please return to the nearest emergency department.

## 2022-11-03 NOTE — ED Provider Notes (Signed)
[facility]    CSN: [id number] Arrival date & time: 11/03/22  1331      History   Chief Complaint Chief Complaint  Patient presents with   Leg Swelling    HPI Monica Lamb is a 82 y.o. female.   Patient presents to urgent care for evaluation of right posterior calf pain and swelling with associated erythema and warmth that has worsened over the last 2 days.  She reports swelling and tenderness to the posterior right calf started 7 to 10 days ago but the warmth and erythema worsened 2 days ago.  She drove 6 hours to [place] 6 days ago on Monday, October 28, 2022 and drove back to [place] 6 hours yesterday.  She believes that the drive worsened symptoms.  No numbness or tingling distally to pain.  She struck her right leg on a coffee table 7 to 10 days ago to an area lower than the pain and swelling currently.  No abrasions or lacerations recently to the right calf.  History of superficial DVT to the left calf last year, she took blood thinners from February to May 2023.  Not currently taking any blood thinners.  Pain is worsened by movement and weightbearing activity.  No shortness of breath, chest pain, heart palpitations, fever/chills, or nausea/vomiting.  No history of pulmonary embolism.     Past Medical History:  Diagnosis Date   Allergy    Atherosclerosis of aorta (HCC) 02/18/2018   C. 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 Roda Shutters remortely; sees Dr. 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      Home Medications    Prior to Admission medications   Medication Sig Start Date End Date Taking? Authorizing Provider  acetaminophen (TYLENOL) 500 MG tablet Take 500 mg by mouth every 6 (six) hours as needed for mild pain or headache.   Yes [provider]  cholecalciferol (VITAMIN D) 1000 units tablet Take 1,000 Units by mouth daily.   Yes [provider]  Ibuprofen-Acetaminophen (ADVIL DUAL ACTION PO) Take by mouth.   Yes [provider]  metroNIDAZOLE (METROCREAM) 0.75 % cream Apply 1 application topically daily. For rosacea   Yes [provider]  Multiple Vitamins-Minerals (ICAPS AREDS 2 PO)  Take by mouth 3 (three) times a week.   Yes [provider]  Phenylephrine-Acetaminophen (TYLENOL SINUS+HEADACHE PO) Take 1 tablet by mouth daily as needed (SINUS HEADACHE).   Yes [provider]  Polyethyl Glycol-Propyl Glycol (SYSTANE OP) Apply 1 drop to eye daily as needed (DRY EYES).   Yes [provider]  RIVAROXABAN Carlena Hurl) VTE STARTER PACK (15 & 20 MG) Follow package directions: Take one 15mg  tablet by mouth twice a day. On day 22, switch to one 20mg  tablet once a day. Take with food. 05/14/21   Philip Aspen, Limmie Patricia, MD  XARELTO 20 MG TABS tablet TAKE 1 TABLET BY MOUTH DAILY WITH SUPPER. 08/06/21   Philip Aspen, Limmie Patricia, MD    Family History Family History  Problem Relation Age of Onset   Arthritis Mother    Hypertension Mother    Macular degeneration Mother    Cancer Father    Lung cancer Father    Arthritis Sister    Hypertension Sister    Cancer Maternal Aunt    Breast cancer Maternal Aunt    Lung cancer Sister    Colon cancer Neg Hx    Colon polyps Neg Hx    Esophageal cancer Neg Hx    Stomach cancer Neg Hx    Rectal cancer Neg Hx     Social History Social History   Tobacco Use   Smoking status: Never   Smokeless tobacco: Never   Tobacco comments:    was around 2ndary smoke  Vaping Use   Vaping status: Never Used  Substance Use Topics   Alcohol use: No   Drug use: No     Allergies   Clindamycin/lincomycin, Other, and Penicillins   Review of Systems Review of Systems Per HPI  Physical Exam Triage Vital Signs ED Triage Vitals  Encounter Vitals Group     BP 11/03/22 1501 (!) 162/72     Systolic BP Percentile --      Diastolic BP Percentile --      Pulse Rate 11/03/22 1501 81     Resp 11/03/22 1501 16     Temp 11/03/22 1501 98.5 F (36.9 C)     Temp Source 11/03/22 1501 Oral     SpO2 11/03/22 1501 96 %     Weight 11/03/22 1500 184 lb 15.5 oz (83.9 kg)     Height 11/03/22 1500 5\' 3"  (1.6 m)     Head  Circumference --      Peak Flow --      Pain Score 11/03/22 1457 4     Pain Loc --      Pain Education --      Exclude from Growth Chart --    No data found.  Updated Vital Signs BP (!) 162/72 (  BP Location: Left Wrist)   Pulse 81   Temp 98.5 F (36.9 C) (Oral)   Resp 16   Ht 5\' 3"  (1.6 m)   Wt 184 lb 15.5 oz (83.9 kg)   SpO2 96%   BMI 32.77 kg/m   Visual Acuity Right Eye Distance:   Left Eye Distance:   Bilateral Distance:    Right Eye Near:   Left Eye Near:    Bilateral Near:     Physical Exam Vitals and nursing note reviewed.  Constitutional:      Appearance: She is not ill-appearing or toxic-appearing.  HENT:     Head: Normocephalic and atraumatic.     Right Ear: Hearing and external ear normal.     Left Ear: Hearing and external ear normal.     Nose: Nose normal.     Mouth/Throat:     Lips: Pink.  Eyes:     General: Lids are normal. Vision grossly intact. Gaze aligned appropriately.     Extraocular Movements: Extraocular movements intact.     Conjunctiva/sclera: Conjunctivae normal.  Pulmonary:     Effort: Pulmonary effort is normal.  Musculoskeletal:     Cervical back: Neck supple.  Skin:    General: Skin is warm and dry.     Capillary Refill: Capillary refill takes less than 2 seconds.     Findings: No rash.          Comments: Positive Homans' sign of right leg.  Neurological:     General: No focal deficit present.     Mental Status: She is alert and oriented to person, place, and time. Mental status is at baseline.     Cranial Nerves: No dysarthria or facial asymmetry.  Psychiatric:        Mood and Affect: Mood normal.        Speech: Speech normal.        Behavior: Behavior normal.        Thought Content: Thought content normal.        Judgment: Judgment normal.      UC Treatments / Results  Labs (all labs ordered are listed, but only abnormal results are displayed) Labs Reviewed - No data to display  EKG   Radiology No results  found.  Procedures Procedures (including critical care time)  Medications Ordered in UC Medications - No data to display  Initial Impression / Assessment and Plan / UC Course  I have reviewed the triage vital signs and the nursing notes.  Pertinent labs & imaging results that were available during my care of the patient were reviewed by me and considered in my medical decision making (see chart for details).   1.  Right calf pain, history of DVT Significant clinical concern for DVT given presentation and history provided.  Advised patient to go to the nearest emergency department for further workup and evaluation of this as we are unable to complete ultrasound in the urgent care setting to rule out DVT.  She is agreeable plan.  Discussed risks of deferring ED visit to which patient further expresses agreement with plan.  She is stable to go by personal vehicle to the nearest ED.  Final Clinical Impressions(s) / UC Diagnoses   Final diagnoses:  Right calf pain  History of DVT (deep vein thrombosis)   Discharge Instructions   None    ED Prescriptions   None    PDMP not reviewed this encounter.   Carlisle Beers, [place] 11/03/22 1549

## 2022-11-03 NOTE — ED Notes (Signed)
Pt ambulated to restroom without incident.

## 2022-11-03 NOTE — ED Notes (Signed)
Patient is being discharged from the Urgent Care and sent to the Emergency Department via POV . Per Juliet Rude NP, patient is in need of higher level of care due to Lower leg edema and history of DVT. Patient is aware and verbalizes understanding of plan of care.  Vitals:   11/03/22 1501  BP: (!) 162/72  Pulse: 81  Resp: 16  Temp: 98.5 F (36.9 C)  SpO2: 96%

## 2022-11-03 NOTE — ED Triage Notes (Signed)
rt leg and calf swelling hot to touch. Onset Sunday last week with a bruise from running into a coffee table. Patient drove to [place] Monday and then drove back yesterday.   During the week the swelling and redness spread. Hot and tender to the touch. Starting to have knee pain and reduced ROM.   History of DVT in the left leg 2023. States this feel slightly similar.

## 2022-11-05 ENCOUNTER — Telehealth: Payer: Self-pay

## 2022-11-05 NOTE — Telephone Encounter (Signed)
Pt called requesting advice d/t painful leg with new diagnosis of DVT.  Reviewed pt's chart, returned call for clarification, two identifiers used. Pt describes swelling pain from mid-calf up to knee on her RLE, burning pain, and warmth. She is taking her Eliquis. Instructed her on proper elevation, wearing compressions, using heat/cool compresses, and taking OTC pain meds. Confirmed understanding.

## 2022-11-06 ENCOUNTER — Telehealth: Payer: Self-pay

## 2022-11-06 NOTE — Transitions of Care (Post Inpatient/ED Visit) (Signed)
11/06/2022  Name: Monica Lamb MRN: [id number] DOB: 07-23-1940  Today's TOC FU Call Status: Today's TOC FU Call Status:: Successful TOC FU Call Completed TOC FU Call Complete Date: 11/06/22   Red on EMMI-ED Discharge Alert Date & Reason:11/05/22 "Scheduled follow-up appt? No"   Transition Care Management Follow-up Telephone Call Date of Discharge: 11/03/22 Discharge Facility: [facility]) Type of Discharge: Emergency Department Reason for ED Visit: Other: (superficial vein thrombosis") How have you been since you were released from the hospital?: Better (Pt states process is going slow. She is taking Tylenol for pain mgmt-sweling shows slight signs of improvement.) Any questions or concerns?: No  Items Reviewed: Did you receive and understand the discharge instructions provided?: Yes Medications obtained,verified, and reconciled?: Yes (Medications Reviewed) Any new allergies since your discharge?: No Dietary orders reviewed?: NA Do you have support at home?: Yes People in Home: alone  Medications Reviewed Today: Medications Reviewed Today     Reviewed by Charlyn Minerva, RN (Registered Nurse) on 11/06/22 at 1148  Med List Status: <None>   Medication Order Taking? Sig Documenting Provider Last Dose Status Informant  acetaminophen (TYLENOL) 500 MG tablet [id number] Yes Take 500 mg by mouth every 6 (six) hours as needed for mild pain or headache. [provider] Taking Active Self, Pharmacy Records  APIXABAN Everlene Balls) VTE STARTER PACK (10MG  AND 5MG ) [id number] Yes Take as directed on package: start with two-5mg  tablets twice daily for 7 days. On day 8, switch to one-5mg  tablet twice daily. Tegeler, Canary Brim, MD Taking Active   cholecalciferol (VITAMIN D) 1000 units tablet [id number]  Take 1,000 Units by mouth daily. [provider]  Active Self, Pharmacy Records  Ibuprofen-Acetaminophen (ADVIL DUAL ACTION PO) [id number] No Take 1 tablet by mouth  every 6 (six) hours as needed.  Patient not taking: Reported on 11/06/2022   [provider] Not Taking Active Self, Pharmacy Records  Multiple Vitamins-Minerals (ICAPS AREDS 2 PO) [id number] Yes Take by mouth 3 (three) times a week. [provider] Taking Active Self, Pharmacy Records  Polyethyl Glycol-Propyl Glycol [facility] OP) [id number] Yes Apply 1 drop to eye daily as needed (DRY EYES). [provider] Taking Active Self, Pharmacy Records  Probiotic Product (PROBIOTIC BLEND PO) [id number] Yes Take 1 tablet by mouth daily at 2 PM. [provider] Taking Active Self, Pharmacy Records  XARELTO 20 MG TABS tablet [id number] No TAKE 1 TABLET BY MOUTH DAILY WITH SUPPER.  Patient not taking: Reported on 11/03/2022   Philip Aspen, Limmie Patricia, MD Not Taking Active Self, Pharmacy Records           Med Note Lia Hopping, Ty Hilts Nov 03, 2022  6:07 PM) Per pt this was only for 90 days            Home Care and Equipment/Supplies: Were Home Health Services Ordered?: NA Any new equipment or medical supplies ordered?: NA  Functional Questionnaire: Do you need assistance with bathing/showering or dressing?: No Do you need assistance with meal preparation?: No Do you need assistance with eating?: No Do you have difficulty maintaining continence: No Do you need assistance with getting out of bed/getting out of a chair/moving?: No Do you have difficulty managing or taking your medications?: No  Follow up appointments reviewed: PCP Follow-up appointment confirmed?: No (pt declined-states she only goes to PCP for annual wellness visit and has done so already this year) MD Provider Line Number:[phone] Given: No Specialist Hospital Follow-up appointment confirmed?: Yes Date  of Specialist follow-up appointment?: 11/19/22 Follow-Up Specialty Provider:: Vascular Specialist-pt voices earliest appt avaiable-she will call office back to get on cancellation/wait  list Do you need transportation to your follow-up appointment?: No Do you understand care options if your condition(s) worsen?: Yes-patient verbalized understanding  SDOH Interventions Today    Flowsheet Row Most Recent Value  SDOH Interventions   Food Insecurity Interventions Intervention Not Indicated  Transportation Interventions Intervention Not Indicated      TOC Interventions Today    Flowsheet Row Most Recent Value  TOC Interventions   TOC Interventions Discussed/Reviewed TOC Interventions Discussed, Post discharge activity limitations per provider      Interventions Today    Flowsheet Row Most Recent Value  General Interventions   General Interventions Discussed/Reviewed General Interventions Discussed, Doctor Visits  Doctor Visits Discussed/Reviewed Doctor Visits Discussed, PCP, Specialist, Annual Wellness Visits  PCP/Specialist Visits Compliance with follow-up visit  Education Interventions   Education Provided Provided Education  Provided Verbal Education On Medication, When to see the doctor, Other  [pain/sx mgmt]  Nutrition Interventions   Nutrition Discussed/Reviewed Nutrition Discussed  Pharmacy Interventions   Pharmacy Dicussed/Reviewed Pharmacy Topics Discussed, Medications and their functions  Safety Interventions   Safety Discussed/Reviewed Safety Discussed       Alessandra [facility] Care Management Care Management Community Coordinator Direct Phone: [phone] Toll Free: [phone] Fax: [phone]

## 2022-11-19 ENCOUNTER — Ambulatory Visit (INDEPENDENT_AMBULATORY_CARE_PROVIDER_SITE_OTHER): Payer: Medicare Other | Admitting: Physician Assistant

## 2022-11-19 VITALS — BP 131/80 | HR 96 | Temp 98.4°F | Resp 18 | Ht 63.0 in | Wt 192.3 lb

## 2022-11-19 DIAGNOSIS — I8002 Phlebitis and thrombophlebitis of superficial vessels of left lower extremity: Secondary | ICD-10-CM

## 2022-11-19 MED ORDER — RIVAROXABAN 20 MG PO TABS: 20.0000 mg | ORAL_TABLET | Freq: Every day | ORAL | 1 refills | Status: DC

## 2022-11-19 NOTE — Progress Notes (Signed)
[facility] [place]   Reason for referral: Swollen right leg  History of Present Illness  Monica Lamb is a 82 y.o. female who presents with chief complaint: swollen leg and pain after trauma to the medial lower leg hitting it on an object 2.5 weeks ago.  She developed superficial thrombophlebitis SVT distal calf to the ankle.  She was seen in the ED and prescribed warm compresses and Xarelto 20 mg every day for 3 months.  She states it looks and feels better.   She has a history of the same injury induced thrombophlebitis on the left LE a year ago.   She underwent the same treatment.   She denies history of DVT/PE.  She denies claudication, rest pain or non healing wounds.     Past Medical History:  Diagnosis Date   Allergy    Atherosclerosis of aorta (HCC) 02/18/2018   C. 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 Roda Shutters remortely; sees Dr. Dagoberto Ligas   Solitary pulmonary nodule 02/06/2016   -solitary pulm nodule, incidental finding on abd CT in 2016 -she discussed with ID and me and was indecisive about whether or not to do a repeat 12 month CT scan, she ultimately decided to hold off on the order, but to call us if she changed her mind  -completed follow up per radiology recommendations in 2019    Past Surgical History:  Procedure Laterality Date   CATARACT EXTRACTION Bilateral 2016   Dr. 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   Procedure: FECAL TRANSPLANT;  Surgeon: Iva Boop, MD;  Location: [facility] ENDOSCOPY;  Service: Endoscopy;  Laterality: N/A;   LYMPH NODE BIOPSY     benign per patient   lymphnode biopsy     TUBAL LIGATION      Social History   Socioeconomic History   Marital status: Widowed    Spouse name: Not on file   Number of children: Not on file   Years of education: Not on file   Highest education level: Professional school degree (e.g., MD, DDS, DVM, JD)  Occupational History   Not on file  Tobacco Use   Smoking status: Never   Smokeless tobacco: Never   Tobacco comments:    was around 2ndary smoke  Vaping Use   Vaping status: Never Used  Substance and Sexual Activity   Alcohol use: No   Drug use: No   Sexual activity: Not Currently  Other Topics Concern   Not on file  Social History Narrative   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[employer], 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      Home Situation: lives alone      Spiritual Beliefs: no church, spiritual side but no formal religion      Lifestyle: no regular exercise; diet is not great -  has [facility] at [facility]   Social Determinants of Health   Financial Resource Strain: Low Risk  (05/15/2022)   Overall Financial Resource Strain (CARDIA)    Difficulty of Paying Living Expenses: Not hard at all  Food Insecurity: No Food Insecurity (11/06/2022)   Hunger Vital Sign    Worried About Running Out of Food in the Last Year: Never true    Ran Out of Food in the Last Year: Never true  Transportation Needs: No Transportation Needs (11/06/2022)   PRAPARE - Administrator, Civil Service (Medical): No    Lack of Transportation (Non-Medical): No  Physical Activity: Unknown (05/15/2022)   Exercise Vital Sign    Days of Exercise per Week: Not on file    Minutes of Exercise per Session: 60 min  Stress: No Stress Concern Present (05/15/2022)   [employer] of Occupational Health - Occupational Stress  Questionnaire    Feeling of Stress : Not at all  Social Connections: Moderately Integrated (05/15/2022)   Social Connection and Isolation Panel [NHANES]    Frequency of Communication with Friends and Family: More than three times a week    Frequency of Social Gatherings with Friends and Family: More than three times a week    Attends Religious Services: Never    Database administrator or Organizations: Yes    Attends Engineer, structural: More than 4 times per year    Marital Status: Married  Catering manager Violence: Not At Risk (05/15/2022)   Humiliation, Afraid, Rape, and Kick questionnaire    Fear of Current or Ex-Partner: No    Emotionally Abused: No    Physically Abused: No    Sexually Abused: No    Family History  Problem Relation Age of Onset   Arthritis Mother    Hypertension Mother    Macular degeneration Mother    Cancer Father    Lung cancer Father    Arthritis Sister    Hypertension Sister    Cancer Maternal Aunt    Breast cancer Maternal Aunt    Lung cancer Sister    Colon cancer Neg Hx    Colon polyps Neg Hx    Esophageal cancer Neg Hx    Stomach cancer Neg Hx    Rectal cancer Neg Hx     Current Outpatient Medications on File Prior to Visit  Medication Sig Dispense Refill   acetaminophen (TYLENOL) 500 MG tablet Take 500 mg by mouth every 6 (six) hours as needed for mild pain or headache.     APIXABAN (ELIQUIS) VTE STARTER PACK (10MG  AND 5MG ) Take as directed on package: start with two-5mg  tablets twice daily for 7 days. On day 8, switch to one-5mg  tablet twice daily. 74 each 0   cholecalciferol (VITAMIN D) 1000 units tablet Take 1,000 Units by mouth daily.     Polyethyl Glycol-Propyl Glycol (SYSTANE OP) Apply 1 drop to eye daily as needed (DRY EYES).     Probiotic Product (PROBIOTIC BLEND PO) Take 1 tablet by mouth daily at 2 PM.     Ibuprofen-Acetaminophen (ADVIL DUAL ACTION PO) Take 1 tablet by mouth every 6 (six) hours as needed. (Patient not  taking: Reported on 11/06/2022)     Multiple Vitamins-Minerals (ICAPS AREDS 2 PO) Take by mouth 3 (three) times a week. (Patient not taking: Reported on 11/19/2022)     XARELTO 20 MG TABS tablet TAKE 1 TABLET BY MOUTH DAILY WITH SUPPER. (Patient not taking: Reported on 11/03/2022) 30  tablet 1   No current facility-administered medications on file prior to visit.    Allergies as of 11/19/2022 - Review Complete 11/19/2022  Allergen Reaction Noted   Clindamycin/lincomycin  11/27/2015   Other  02/06/2017   Penicillins Rash 09/13/2015     ROS:   General:  No weight loss, Fever, chills  HEENT: No recent headaches, no nasal bleeding, no visual changes, no sore throat  Neurologic: No dizziness, blackouts, seizures. No recent symptoms of stroke or mini- stroke. No recent episodes of slurred speech, or temporary blindness.  Cardiac: No recent episodes of chest pain/pressure, no shortness of breath at rest.  No shortness of breath with exertion.  Denies history of atrial fibrillation or irregular heartbeat  Vascular: No history of rest pain in feet.  No history of claudication.  No history of non-healing ulcer, No history of DVT   Pulmonary: No home oxygen, no productive cough, no hemoptysis,  No asthma or wheezing  Musculoskeletal:  [x ] Arthritis, [ ]  Low back pain,  [x ] Joint pain  Hematologic:No history of hypercoagulable state.  No history of easy bleeding.  No history of anemia  Gastrointestinal: No hematochezia or melena,  No gastroesophageal reflux, no trouble swallowing  Urinary: [ ]  chronic Kidney disease, [ ]  on HD - [ ]  MWF or [ ]  TTHS, [ ]  Burning with urination, [ ]  Frequent urination, [ ]  Difficulty urinating;   Skin: No rashes  Psychological: No history of anxiety,  No history of depression  Physical Examination  Vitals:   11/19/22 1322  BP: 131/80  Pulse: 96  Resp: 18  Temp: 98.4 F (36.9 C)  TempSrc: Temporal  SpO2: 94%  Weight: 192 lb 4.8 oz (87.2 kg)  Height:  5\' 3"  (1.6 m)    Body mass index is 34.06 kg/m.  General:  Alert and oriented, no acute distress HEENT: Normal Neck: No bruit or JVD Pulmonary: Clear to auscultation bilaterally Cardiac: Regular Rate and Rhythm without murmur Abdomen: Soft, non-tender, non-distended, no mass, no scars Skin: No rash  Extremity Pulses:B radial, dorsalis pedis pulses bilaterally Musculoskeletal: No deformity or edema  Neurologic: Upper and lower extremity motor 5/5 and symmetric  DATA:  RIGHT    CompressibilityPhasicitySpontaneityPropertiesThrombus  Aging  +---------+---------------+---------+-----------+----------+--------------+   CFV     Full           Yes      Yes                                   +---------+---------------+---------+-----------+----------+--------------+   SFJ     Full                                                          +---------+---------------+---------+-----------+----------+--------------+   FV Prox  Full           Yes      Yes                                   +---------+---------------+---------+-----------+----------+--------------+   FV Mid   Full           Yes      Yes                                   +---------+---------------+---------+-----------+----------+--------------+  FV DistalFull           Yes      Yes                                   +---------+---------------+---------+-----------+----------+--------------+   PFV     Full                                                          +---------+---------------+---------+-----------+----------+--------------+   POP     Full           Yes      Yes                                   +---------+---------------+---------+-----------+----------+--------------+   PTV     Full                                                          +---------+---------------+---------+-----------+----------+--------------+   PERO                                                  not  visualized  +---------+---------------+---------+-----------+----------+--------------+   GSV     None           No       No                   Acute            +---------+---------------+---------+-----------+----------+--------------+   VV      None           No       No         dilated   Acute            +---------+---------------+---------+-----------+----------+--------------+    SVT if GSV extending from distal calf to ankle. Multiple thrombosed and  dilated varicose veins seen extending from knee to distal calf.         Summary:  RIGHT:  - Findings consistent with acute superficial vein thrombosis involving the  right great saphenous vein, and right varicosities or other superficial  veins.  - There is no evidence of deep vein thrombosis in the lower extremity.    - No cystic structure found in the popliteal fossa.    LEFT:  - No evidence of common femoral vein obstruction.   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 I 49-month course of anticoagulation will be sufficient. She can follow-up in 4-6 weeks for repeat Duplex to ease her mind since this is the second occurrence of SVT thrombophlebitis.   If she has concerns or issues she will call sooner.  I refilled her Xarelto.    Mosetta Pigeon PA-C [facility] [place] Office: [phone]  MD in clinic [place]

## 2022-11-27 ENCOUNTER — Telehealth: Payer: Self-pay

## 2022-11-27 NOTE — Telephone Encounter (Signed)
Pt called requesting clarification on her anticoagulation since she has a prescription for two different ones.  Spoke with Marisue Humble, [place] and she advised that the pt take the Xarelto since she was just given a refill on 8/20.  Reviewed pt's chart, two identifiers used. Informed her of PA's advice. Confirmed understanding.

## 2022-12-10 ENCOUNTER — Other Ambulatory Visit: Payer: Self-pay

## 2022-12-10 DIAGNOSIS — M7989 Other specified soft tissue disorders: Secondary | ICD-10-CM

## 2022-12-31 ENCOUNTER — Telehealth: Payer: Self-pay

## 2022-12-31 ENCOUNTER — Other Ambulatory Visit: Payer: Self-pay

## 2022-12-31 ENCOUNTER — Ambulatory Visit: Payer: Medicare Other

## 2022-12-31 ENCOUNTER — Ambulatory Visit (HOSPITAL_COMMUNITY): Admission: RE | Admit: 2022-12-31 | Payer: Medicare Other | Source: Ambulatory Visit

## 2022-12-31 NOTE — Telephone Encounter (Signed)
Pt called w/ concerns after having to r/s her appts d/t [place] tech being out sick.   Reviewed pt's chart, returned call for clarification, two identifiers used. Pt is taking a trip to [place] next week and will be driving for approx 6 hrs. She also was unsure about refilling her Xarelto. Instructed her to refill her prescription and take until she's seen again. Instructed her to wear compressions, elevate her legs often and at HS, and get out every hr to walk. Pt will be coming into the office to be fitted for compressions this week. Pt transferred to scheduling to r/s appts. Confirmed understanding.

## 2023-01-02 DIAGNOSIS — M7989 Other specified soft tissue disorders: Secondary | ICD-10-CM

## 2023-01-29 ENCOUNTER — Ambulatory Visit (HOSPITAL_COMMUNITY)
Admission: RE | Admit: 2023-01-29 | Discharge: 2023-01-29 | Disposition: A | Discharge status: Home / Self Care | Payer: Medicare Other | Source: Ambulatory Visit | Attending: Vascular Surgery | Admitting: Vascular Surgery

## 2023-01-29 DIAGNOSIS — M7989 Other specified soft tissue disorders: Secondary | ICD-10-CM | POA: Insufficient documentation

## 2023-02-04 ENCOUNTER — Ambulatory Visit (INDEPENDENT_AMBULATORY_CARE_PROVIDER_SITE_OTHER): Payer: Medicare Other | Admitting: Physician Assistant

## 2023-02-04 ENCOUNTER — Other Ambulatory Visit: Payer: Self-pay | Admitting: Physician Assistant

## 2023-02-04 VITALS — BP 143/83 | HR 90 | Temp 97.3°F | Ht 63.0 in | Wt 185.0 lb

## 2023-02-04 DIAGNOSIS — I8002 Phlebitis and thrombophlebitis of superficial vessels of left lower extremity: Secondary | ICD-10-CM

## 2023-02-04 DIAGNOSIS — M7989 Other specified soft tissue disorders: Secondary | ICD-10-CM

## 2023-02-04 DIAGNOSIS — I872 Venous insufficiency (chronic) (peripheral): Secondary | ICD-10-CM

## 2023-02-04 NOTE — Progress Notes (Unsigned)
Office Note     CC:  follow up Requesting Provider:  Philip Aspen, [place]*  HPI: Monica Lamb is a 82 y.o. (1940-05-16) female who presents for follow up of RLE SVT. She has history of two occurrences after some local trauma, initially on the left and more recently the right leg. She was seen in ED at the time and started on Xarelto. She just completed 3 months of Xarelto for her most recent SVT. She does elevate and wears thigh high compression intermittently.  She does report some tenderness still in the area with palpable cord. She does also get some aching in her legs if she does any prolonged standing or ambulation. Generally this is improved with elevation.    Past Medical History:  Diagnosis Date   Allergy    Atherosclerosis of aorta (HCC) 02/18/2018   C. 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 Roda Shutters remortely; sees Dr. Dagoberto Ligas   Solitary pulmonary nodule 02/06/2016   -solitary pulm nodule, incidental finding on abd CT in 2016 -she discussed with ID and me and was indecisive about whether or not to do a repeat 12 month CT scan, she ultimately decided to hold off on the order, but to call us if she changed her mind  -completed follow up per radiology recommendations in 2019    Past Surgical History:  Procedure Laterality Date   CATARACT EXTRACTION Bilateral 2016   Dr. 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   Procedure: FECAL  TRANSPLANT;  Surgeon: Iva Boop, MD;  Location: [facility];  Service: Endoscopy;  Laterality: N/A;   LYMPH NODE BIOPSY     benign per patient   lymphnode biopsy     TUBAL LIGATION      Social History   Socioeconomic History   Marital status: Widowed    Spouse name: Not on file   Number of children: Not on file   Years of education: Not on file   Highest education level: Professional school degree (e.g., MD, DDS, DVM, JD)  Occupational History   Not on file  Tobacco Use   Smoking status: Never   Smokeless tobacco: Never   Tobacco comments:    was around 2ndary smoke  Vaping Use   Vaping status: Never Used  Substance and Sexual Activity   Alcohol use: No   Drug use: No   Sexual activity: Not Currently  Other Topics Concern   Not on file  Social History Narrative   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[employer], 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      Home Situation: lives alone      Spiritual Beliefs: no church, spiritual side but no formal religion      Lifestyle: no regular exercise; diet is not great - has Research scientist (physical sciences) at [employer]   Social Determinants of  Health   Financial Resource Strain: Low Risk  (05/15/2022)   Overall Financial Resource Strain (CARDIA)    Difficulty of Paying Living Expenses: Not hard at all  Food Insecurity: No Food Insecurity (11/06/2022)   Hunger Vital Sign    Worried About Running Out of Food in the Last Year: Never true    Ran Out of Food in the Last Year: Never true  Transportation Needs: No Transportation Needs (11/06/2022)   PRAPARE - Administrator, Civil Service (Medical): No    Lack of Transportation (Non-Medical): No  Physical Activity: Unknown (05/15/2022)   Exercise Vital Sign    Days of Exercise per Week: Not on file    Minutes of Exercise per Session: 60 min  Stress: No Stress Concern Present (05/15/2022)   [employer] of Occupational Health - Occupational Stress Questionnaire    Feeling of Stress  : Not at all  Social Connections: Moderately Integrated (05/15/2022)   Social Connection and Isolation Panel [NHANES]    Frequency of Communication with Friends and Family: More than three times a week    Frequency of Social Gatherings with Friends and Family: More than three times a week    Attends Religious Services: Never    Database administrator or Organizations: Yes    Attends Engineer, structural: More than 4 times per year    Marital Status: Married  Catering manager Violence: Not At Risk (05/15/2022)   Humiliation, Afraid, Rape, and Kick questionnaire    Fear of Current or Ex-Partner: No    Emotionally Abused: No    Physically Abused: No    Sexually Abused: No   *** Family History  Problem Relation Age of Onset   Arthritis Mother    Hypertension Mother    Macular degeneration Mother    Cancer Father    Lung cancer Father    Arthritis Sister    Hypertension Sister    Cancer Maternal Aunt    Breast cancer Maternal Aunt    Lung cancer Sister    Colon cancer Neg Hx    Colon polyps Neg Hx    Esophageal cancer Neg Hx    Stomach cancer Neg Hx    Rectal cancer Neg Hx     Current Outpatient Medications  Medication Sig Dispense Refill   acetaminophen (TYLENOL) 500 MG tablet Take 500 mg by mouth every 6 (six) hours as needed for mild pain or headache.     cholecalciferol (VITAMIN D) 1000 units tablet Take 1,000 Units by mouth daily.     Ibuprofen-Acetaminophen (ADVIL DUAL ACTION PO) Take 1 tablet by mouth every 6 (six) hours as needed. (Patient not taking: Reported on 11/06/2022)     Multiple Vitamins-Minerals (ICAPS AREDS 2 PO) Take by mouth 3 (three) times a week. (Patient not taking: Reported on 11/19/2022)     Polyethyl Glycol-Propyl Glycol (SYSTANE OP) Apply 1 drop to eye daily as needed (DRY EYES).     Probiotic Product (PROBIOTIC BLEND PO) Take 1 tablet by mouth daily at 2 PM.     rivaroxaban (XARELTO) 20 MG TABS tablet Take 1 tablet (20 mg total) by mouth daily  with supper. 30 tablet 1   XARELTO 20 MG TABS tablet TAKE 1 TABLET BY MOUTH DAILY WITH SUPPER. (Patient not taking: Reported on 11/03/2022) 30 tablet 1   No current facility-administered medications for this visit.    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 Low risk: Metronidazole, aminoglycosides, vancomycin, chloramphenical Medium risk; tetracyclines, sulfonamides, macrolides and teicoplanine High risk; penicillins, cephalosporins, carbapenems, quinolones and clindamycin    Penicillins Rash    Has patient had a PCN reaction causing immediate rash, facial/tongue/throat swelling, SOB or lightheadedness with hypotension: No Has patient had a PCN reaction causing severe rash involving mucus membranes or skin necrosis: No Has patient had a PCN reaction that required hospitalization No Has patient had a PCN reaction occurring within the last 10 years: No If all of the above answers are "NO", then may proceed with Cephalosporin use.      REVIEW OF SYSTEMS:  *** [X]  denotes positive finding, [ ]  denotes negative finding Cardiac  Comments:  Chest pain or chest pressure:    Shortness of breath upon exertion:    Short of breath when lying flat:    Irregular heart rhythm:        Vascular    Pain in calf, thigh, or hip brought on by ambulation:    Pain in feet at night that wakes you up from your sleep:     Blood clot in your veins:    Leg swelling:         Pulmonary    Oxygen at home:    Productive cough:     Wheezing:         Neurologic    Sudden weakness in arms or legs:     Sudden numbness in arms or legs:     Sudden onset of difficulty speaking or slurred speech:    Temporary loss of vision in one eye:     Problems with dizziness:         Gastrointestinal    Blood in stool:     Vomited blood:         Genitourinary    Burning when urinating:     Blood in urine:         Psychiatric    Major depression:         Hematologic    Bleeding problems:    Problems with blood clotting too easily:        Skin    Rashes or ulcers:        Constitutional    Fever or chills:      PHYSICAL EXAMINATION:  Vitals:   02/04/23 1343  BP: (!) 143/83  Pulse: 90  Temp: (!) 97.3 F (36.3 C)  SpO2: 98%  Weight: 185 lb (83.9 kg)  Height: 5\' 3"  (1.6 m)    General:  WDWN in NAD; vital signs documented above Gait: Not observed HENT: WNL, normocephalic Pulmonary: normal non-labored breathing , without Rales, rhonchi,  wheezing Cardiac: {Desc; regular/irreg:14544} HR Abdomen: soft Vascular Exam/Pulses: *** Extremities: {With/Without:20273} ischemic changes, {With/Without:20273} Gangrene , {With/Without:20273} cellulitis; {With/Without:20273} open wounds;  Musculoskeletal: no muscle wasting or atrophy  Neurologic: A&O X 3 Psychiatric:  The pt has  affect.   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    - There is no evidence of acute deep vein thrombosis in the lower extremity.    - No cystic structure found in the popliteal fossa.    LEFT:  - No evidence of common femoral vein obstruction.      ASSESSMENT/PLAN:: 82 y.o. female here for  follow up for ***   -    Graceann Congress, PA-C [facility] [phone]  Clinic MD:   Lenell Antu

## 2023-02-05 ENCOUNTER — Encounter: Payer: Self-pay | Admitting: Physician Assistant

## 2023-08-14 ENCOUNTER — Other Ambulatory Visit: Payer: Self-pay

## 2023-08-14 ENCOUNTER — Ambulatory Visit (HOSPITAL_COMMUNITY)
Admission: RE | Admit: 2023-08-14 | Discharge: 2023-08-14 | Disposition: A | Discharge status: Home / Self Care | Source: Ambulatory Visit | Attending: Vascular Surgery | Admitting: Vascular Surgery

## 2023-08-14 ENCOUNTER — Ambulatory Visit: Attending: Vascular Surgery | Admitting: Physician Assistant

## 2023-08-14 ENCOUNTER — Telehealth: Payer: Self-pay

## 2023-08-14 VITALS — BP 146/88 | HR 76 | Temp 97.8°F | Ht 63.0 in | Wt 191.0 lb

## 2023-08-14 DIAGNOSIS — I8002 Phlebitis and thrombophlebitis of superficial vessels of left lower extremity: Secondary | ICD-10-CM

## 2023-08-14 DIAGNOSIS — I83812 Varicose veins of left lower extremities with pain: Secondary | ICD-10-CM | POA: Diagnosis not present

## 2023-08-14 NOTE — Progress Notes (Signed)
 Office Note   History of Present Illness   Monica Lamb is a 83 y.o. (10-31-1940) female who presents as a triage visit.  She has previously been evaluated in our office for superficial venous thrombosis.  She has had 2 incidences of SVT, once on the left and more recently once on the right.  Her last duplex at our office 6 months ago demonstrated no recurrent or progressive SVT and her Xarelto  was discontinued.  She returns today as a triage visit.  She says over the past couple of months she has noticed an increasing size to the varicosities on her left mid thigh.  As these varicosities have gotten larger, she has been having sharp pains in them.  This pain also radiates behind her left knee.  Her pain is aggravated by prolonged immobility and by trying to bend her knee.  Her pain is worse later on the day.  She denies any skin changes to the area.  She denies any bleeding events.  She denies any increased lower extremity swelling.   Past Medical History:  Diagnosis Date   Allergy    Atherosclerosis of aorta (HCC) 02/18/2018   C. 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 Morrell Aran remortely; sees Dr. Matthew Songster   Solitary pulmonary nodule 02/06/2016   -solitary pulm nodule, incidental finding on abd CT in 2016 -she discussed with ID and me and was indecisive about whether or not to do a repeat 12 month CT scan, she ultimately decided to hold off on the order, but to call us  if she changed her mind  -completed follow up per radiology recommendations in 2019    Past Surgical History:  Procedure Laterality Date   CATARACT EXTRACTION Bilateral 2016   Dr. 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   Procedure: FECAL TRANSPLANT;  Surgeon: Kenney Peacemaker, MD;  Location: [facility];  Service: Endoscopy;  Laterality: N/A;   LYMPH NODE BIOPSY     benign per patient   lymphnode biopsy     TUBAL LIGATION      Social History   Socioeconomic History   Marital status: Widowed    Spouse name: Not on file   Number of children: Not on file   Years of education: Not on file   Highest education level: Professional school degree (e.g., MD, DDS, DVM, JD)  Occupational History   Not on file  Tobacco Use   Smoking status: Never   Smokeless tobacco: Never   Tobacco comments:    was around 2ndary smoke  Vaping Use   Vaping status: Never Used  Substance and Sexual Activity   Alcohol use: No   Drug use: No   Sexual activity: Not Currently  Other Topics  Concern   Not on file  Social History Narrative   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[employer], 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      Home Situation: lives alone      Spiritual Beliefs: no church, spiritual side but no formal religion      Lifestyle: no regular exercise; diet is not great - has Research scientist (physical sciences) at [employer]   Social Drivers of Health   Financial Resource Strain: Low Risk  (05/15/2022)   Overall Financial Resource Strain (CARDIA)    Difficulty of Paying Living Expenses: Not hard at all  Food Insecurity: No Food Insecurity (11/06/2022)   Hunger Vital Sign    Worried About Running Out of Food in the Last Year: Never true    Ran Out of Food in the Last Year: Never true  Transportation Needs: No Transportation Needs (11/06/2022)   PRAPARE - Administrator, Civil Service (Medical): No    Lack of Transportation (Non-Medical): No  Physical  Activity: Unknown (05/15/2022)   Exercise Vital Sign    Days of Exercise per Week: Not on file    Minutes of Exercise per Session: 60 min  Stress: No Stress Concern Present (05/15/2022)   [employer] of Occupational Health - Occupational Stress Questionnaire    Feeling of Stress : Not at all  Social Connections: Moderately Integrated (05/15/2022)   Social Connection and Isolation Panel [NHANES]    Frequency of Communication with Friends and Family: More than three times a week    Frequency of Social Gatherings with Friends and Family: More than three times a week    Attends Religious Services: Never    Database administrator or Organizations: Yes    Attends Engineer, structural: More than 4 times per year    Marital Status: Married  Catering manager Violence: Not At Risk (05/15/2022)   Humiliation, Afraid, Rape, and Kick questionnaire    Fear of Current or Ex-Partner: No    Emotionally Abused: No    Physically Abused: No    Sexually Abused: No    Family History  Problem Relation Age of Onset   Arthritis Mother    Hypertension Mother    Macular degeneration Mother    Cancer Father    Lung cancer Father    Arthritis Sister    Hypertension Sister    Cancer Maternal Aunt    Breast cancer Maternal Aunt    Lung cancer Sister    Colon cancer Neg Hx    Colon polyps Neg Hx    Esophageal cancer Neg Hx    Stomach cancer Neg Hx    Rectal cancer Neg Hx     Current Outpatient Medications  Medication Sig Dispense Refill   acetaminophen (TYLENOL) 500 MG tablet Take 500 mg by mouth every 6 (six) hours as needed for mild pain or headache.     cholecalciferol (VITAMIN D ) 1000 units tablet Take 1,000 Units by mouth daily.     Ibuprofen-Acetaminophen (ADVIL DUAL ACTION PO) Take 1 tablet by mouth every 6 (six) hours as needed.     Multiple Vitamins-Minerals (ICAPS AREDS 2 PO) Take by mouth 3 (three) times a week.     Polyethyl Glycol-Propyl Glycol (SYSTANE OP) Apply 1 drop to eye  daily as needed (DRY EYES).     Probiotic Product (PROBIOTIC BLEND PO) Take 1 tablet by mouth daily at 2 PM.     No current facility-administered medications for this visit.  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 Low risk: Metronidazole , aminoglycosides, vancomycin , chloramphenical Medium risk; tetracyclines, sulfonamides, macrolides and teicoplanine High risk; penicillins, cephalosporins, carbapenems, quinolones and clindamycin    Penicillins Rash    Has patient had a PCN reaction causing immediate rash, facial/tongue/throat swelling, SOB or lightheadedness with hypotension: No Has patient had a PCN reaction causing severe rash involving mucus membranes or skin necrosis: No Has patient had a PCN reaction that required hospitalization No Has patient had a PCN reaction occurring within the last 10 years: No If all of the above answers are "NO", then may proceed with Cephalosporin use.     REVIEW OF SYSTEMS (negative unless checked):   Cardiac:  []  Chest pain or chest pressure? []  Shortness of breath upon activity? []  Shortness of breath when lying flat? []  Irregular heart rhythm?  Vascular:  []  Pain in calf, thigh, or hip brought on by walking? []  Pain in feet at night that wakes you up from your sleep? []  Blood clot in your veins? []  Leg swelling?  Pulmonary:  []  Oxygen at home? []  Productive cough? []  Wheezing?  Neurologic:  []  Sudden weakness in arms or legs? []  Sudden numbness in arms or legs? []  Sudden onset of difficult speaking or slurred speech? []  Temporary loss of vision in one eye? []  Problems with dizziness?  Gastrointestinal:  []  Blood in stool? []  Vomited blood?  Genitourinary:  []  Burning when urinating? []  Blood in urine?  Psychiatric:  []  Major depression  Hematologic:  []  Bleeding problems? []  Problems with blood  clotting?  Dermatologic:  []  Rashes or ulcers?  Constitutional:  []  Fever or chills?  Ear/Nose/Throat:  []  Change in hearing? []  Nose bleeds? []  Sore throat?  Musculoskeletal:  []  Back pain? []  Joint pain? []  Muscle pain?   Physical Examination     Vitals:   08/14/23 1359  BP: (!) 146/88  Pulse: 76  Temp: 97.8 F (36.6 C)  TempSrc: Temporal  SpO2: 95%  Weight: 191 lb (86.6 kg)  Height: 5\' 3"  (1.6 m)   Body mass index is 33.83 kg/m.  General:  WDWN in NAD; vital signs documented above Gait: Not observed HENT: WNL, normocephalic Pulmonary: normal non-labored breathing  Cardiac: regular Abdomen: soft, NT, no masses Skin: without rashes Vascular Exam/Pulses: BLE warm and well perfused Extremities: LLE with mild edema, no erythema. Dilated varicose veins around the mid-distal left thigh and knee joint Musculoskeletal: no muscle wasting or atrophy  Neurologic: A&O X 3;  No focal weakness or paresthesias are detected Psychiatric:  The pt has Normal affect.  Non-invasive Vascular Imaging   LLE DVT Study (08/14/2023):  No evidence of DVT or SVT in the LLE  Medical Decision Making   Monica Lamb is a 83 y.o. female who presents as a triage visit  We have previously evaluated the patient for superficial venous thrombosis in bilateral lower extremities after local trauma.  She was last seen by our office 6 months ago with a negative DVT study. She returns today with concerns for recurrent SVT in her left leg.  She says that the varicose veins in her distal left thigh have gotten larger and more painful over the past couple of months.  This causes her significant stabbing and throbbing pain later on in the day. Ultrasound of the left leg demonstrates no DVT or SVT On exam she does  have dilated varicose veins in her mid to distal left thigh, nearing the knee joint.  She does not have a palpable cord I have explained to the patient that she has no evidence of  recurrent SVT.  She also has no evidence of DVT.  I have explained that her pain in her varicose veins is common later on the day due to increased venous pressure.  She would benefit from continue with leg elevation and local compression to the area.  Currently she wears knee-high compression stockings, which will not cover all of her varicosities.  I have shown the patient how to apply an Ace bandage to her thigh to decompress her varicose veins.  I believe that this will help control her symptoms. She can follow-up with our office as needed   Ron Cobbs, PA-C [facility] [place] Office: [phone]  08/14/2023, 2:00PM  Clinic MD: Rosalva Comber

## 2023-08-14 NOTE — Telephone Encounter (Addendum)
 Triage: -experiences new symptoms of pain and tenderness behind the L knee that is worsening over the last couple of months even having difficulty even sitting with her knee bent.  Pain originates middle posterior knee and travels down and around calf -recent hx of SVT and has been off Xarelto  since November. -DVT L LE ordered with PA

## 2023-08-19 ENCOUNTER — Encounter (HOSPITAL_COMMUNITY)

## 2023-08-19 ENCOUNTER — Encounter

## 2023-08-19 ENCOUNTER — Telehealth: Payer: Self-pay | Admitting: *Deleted

## 2023-08-19 DIAGNOSIS — M76829 Posterior tibial tendinitis, unspecified leg: Secondary | ICD-10-CM

## 2023-08-19 NOTE — Telephone Encounter (Signed)
 Copied from CRM [id number]. Topic: Referral - Question >> Aug 19, 2023  3:59 PM Magdalene School wrote: Reason for CRM:  The patient called to report that she contacted her physical therapy office, where she has been receiving treatment for the past 4-5 years. She was informed that a referral from her primary care provider (PCP) is now required to schedule future appointments.  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 During flare-ups, she typically undergoes physical therapy to manage the condition.  When asked if she had discussed the referral with her PCP, the patient stated that she had not, as a referral was never previously required. Upon informing her that an appointment with her PCP is necessary to initiate the referral process, she expressed frustration and asked if the referral could be sent without a visit.  The patient is requesting that a referral be sent to:  [facility] Fax: [phone]

## 2023-08-20 NOTE — Telephone Encounter (Signed)
 Referral placed.

## 2023-10-13 ENCOUNTER — Ambulatory Visit (INDEPENDENT_AMBULATORY_CARE_PROVIDER_SITE_OTHER): Payer: Medicare Other

## 2023-10-13 VITALS — BP 120/60 | HR 76 | Temp 96.9°F | Ht 63.0 in | Wt 186.8 lb

## 2023-10-13 DIAGNOSIS — Z0000 Encounter for general adult medical examination without abnormal findings: Secondary | ICD-10-CM | POA: Diagnosis not present

## 2023-10-13 NOTE — Progress Notes (Signed)
 Subjective:   Monica Lamb is a 83 y.o. who presents for a Medicare Wellness preventive visit.  As a reminder, Annual Wellness Visits don't include a physical exam, and some assessments may be limited, especially if this visit is performed virtually. We may recommend an in-person follow-up visit with your provider if needed.  Visit Complete: In person    Persons Participating in Visit: Patient.  AWV Questionnaire: No: Patient Medicare AWV questionnaire was not completed prior to this visit.  Cardiac Risk Factors include: advanced age (>76men, >20 women)     Objective:    Today's Vitals   10/13/23 1303  BP: 120/60  Pulse: 76  Temp: (!) 96.9 F (36.1 C)  TempSrc: Oral  SpO2: 96%  Weight: 186 lb 12.8 oz (84.7 kg)  Height: 5' 3 (1.6 m)   Body mass index is 33.09 kg/m.     10/13/2023    1:21 PM 11/03/2022    4:05 PM 05/15/2022   12:54 PM 05/10/2021    1:57 PM 06/25/2019    6:11 PM 02/10/2018    2:29 PM 02/06/2017    2:38 PM  Advanced Directives  Does Patient Have a Medical Advance Directive? Yes No Yes Yes Yes Yes  Yes   Type of Estate agent of [place];Living will  Healthcare Power of [place];Living will Healthcare Power of [place];Living will     Does patient want to make changes to medical advance directive?    No - Patient declined No - Patient declined    Copy of Healthcare Power of Attorney in Chart? No - copy requested  No - copy requested No - copy requested     Would patient like information on creating a medical advance directive?  No - Patient declined          Data saved with a previous flowsheet row definition    Current Medications (verified) Outpatient Encounter Medications as of 10/13/2023  Medication Sig   acetaminophen (TYLENOL) 500 MG tablet Take 500 mg by mouth every 6 (six) hours as needed for mild pain or headache.   cholecalciferol (VITAMIN D ) 1000 units tablet Take 1,000 Units by mouth daily.   Ibuprofen-Acetaminophen  (ADVIL DUAL ACTION PO) Take 1 tablet by mouth every 6 (six) hours as needed.   Multiple Vitamins-Minerals (ICAPS AREDS 2 PO) Take by mouth 3 (three) times a week.   Polyethyl Glycol-Propyl Glycol (SYSTANE OP) Apply 1 drop to eye daily as needed (DRY EYES).   Probiotic Product (PROBIOTIC BLEND PO) Take 1 tablet by mouth daily at 2 PM.   No facility-administered encounter medications on file as of 10/13/2023.    Allergies (verified) Clindamycin/lincomycin, Other, and Penicillins   History: Past Medical History:  Diagnosis Date   Allergy    Atherosclerosis of aorta (HCC) 02/18/2018   C. 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 Louetta remortely; sees Dr. Rosan   Solitary pulmonary nodule 02/06/2016   -solitary pulm nodule, incidental finding on abd CT in 2016 -she discussed with ID and me and was indecisive about whether or not to do a repeat 12 month CT scan,  she ultimately decided to hold off on the order, but to call us  if she changed her mind  -completed follow up per radiology recommendations in 2019   Past Surgical History:  Procedure Laterality Date   CATARACT EXTRACTION Bilateral 2016   Dr. 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   Procedure: FECAL TRANSPLANT;  Surgeon: Avram Lupita BRAVO, MD;  Location: [facility];  Service: Endoscopy;  Laterality: N/A;   LYMPH NODE BIOPSY     benign per patient   lymphnode biopsy     TUBAL LIGATION      Family History  Problem Relation Age of Onset   Arthritis Mother    Hypertension Mother    Macular degeneration Mother    Cancer Father    Lung cancer Father    Arthritis Sister    Hypertension Sister    Cancer Maternal Aunt    Breast cancer Maternal Aunt    Lung cancer Sister    Colon cancer Neg Hx    Colon polyps Neg Hx    Esophageal cancer Neg Hx    Stomach cancer Neg Hx    Rectal cancer Neg Hx    Social History   Socioeconomic History   Marital status: Widowed    Spouse name: Not on file   Number of children: Not on file   Years of education: Not on file   Highest education level: Professional school degree (e.g., MD, DDS, DVM, JD)  Occupational History   Not on file  Tobacco Use   Smoking status: Never   Smokeless tobacco: Never   Tobacco comments:    was around 2ndary smoke  Vaping Use   Vaping status: Never Used  Substance and Sexual Activity   Alcohol use: No   Drug use: No   Sexual activity: Not Currently  Other Topics Concern   Not on file  Social History Narrative   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[employer], 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      Home Situation: lives alone      Spiritual Beliefs: no church, spiritual side but no formal religion      Lifestyle: no regular exercise; diet is not great - has Research scientist (physical sciences) at [employer]   Social Drivers of Health   Financial Resource Strain: Low Risk  (10/13/2023)   Overall Financial Resource Strain (CARDIA)    Difficulty of Paying Living Expenses: Not hard at all  Food Insecurity: No Food Insecurity (10/13/2023)   Hunger Vital Sign    Worried About Running Out of Food in the Last Year: Never true    Ran Out of Food in the Last Year: Never true  Transportation Needs: No Transportation Needs (10/13/2023)   PRAPARE - Administrator, Civil Service (Medical): No    Lack of Transportation (Non-Medical): No  Physical Activity: Insufficiently Active (10/13/2023)   Exercise Vital Sign     Days of Exercise per Week: 3 days    Minutes of Exercise per Session: 20 min  Stress: No Stress Concern Present (05/15/2022)   [employer] of Occupational Health - Occupational Stress Questionnaire    Feeling of Stress : Not at all  Social Connections: Moderately Integrated (10/13/2023)  Social Connection and Isolation Panel    Frequency of Communication with Friends and Family: More than three times a week    Frequency of Social Gatherings with Friends and Family: More than three times a week    Attends Religious Services: More than 4 times per year    Active Member of [employer] or Organizations: Yes    Attends Banker Meetings: More than 4 times per year    Marital Status: Widowed    Tobacco Counseling Counseling given: Not Answered Tobacco comments: was around 2ndary smoke    Clinical Intake:  Pre-visit preparation completed: Yes  Pain : No/denies pain     BMI - recorded: 33.09 Nutritional Status: BMI > 30  Obese Nutritional Risks: None Diabetes: No  Lab Results  Component Value Date   HGBA1C 5.7 02/06/2016     How often do you need to have someone help you when you read instructions, pamphlets, or other written materials from your doctor or pharmacy?: 1 - Never  Interpreter Needed?: No  Information entered by :: Rojelio Blush LPN   Activities of Daily Living     10/13/2023    1:20 PM  In your present state of health, do you have any difficulty performing the following activities:  Hearing? 0  Vision? 0  Difficulty concentrating or making decisions? 0  Walking or climbing stairs? 0  Dressing or bathing? 0  Doing errands, shopping? 0  Preparing Food and eating ? N  Using the Toilet? N  In the past six months, have you accidently leaked urine? N  Do you have problems with loss of bowel control? N  Managing your Medications? N  Managing your Finances? N  Housekeeping or managing your Housekeeping? N    Patient Care Team: Theophilus Andrews, Tully GRADE, MD as PCP - General (Internal Medicine) Luiz Channel, MD as Consulting Physician (Infectious Diseases) Barbette Knock, MD as Consulting Physician (Obstetrics and Gynecology)  I have updated your Care Teams any recent Medical Services you may have received from other providers in the past year.     Assessment:   This is a routine wellness examination for [place].  Hearing/Vision screen Hearing Screening - Comments:: Denies hearing difficulties   Vision Screening - Comments:: Wears rx glasses - up to date with routine eye exams with  Dr Godfrey   Goals Addressed               This Visit's Progress     Increase physical activity (pt-stated)        Remain active.       Depression Screen     10/13/2023    1:05 PM 05/15/2022   12:49 PM 05/10/2021    1:52 PM 05/02/2021    9:46 AM 06/25/2019    6:11 PM 03/11/2019   10:25 AM 02/10/2018    2:34 PM  PHQ 2/9 Scores  PHQ - 2 Score 0 0 0 0 0 0 0  PHQ- 9 Score   0 0  1     Fall Risk     10/13/2023    1:20 PM 05/15/2022   12:52 PM 05/10/2021    1:57 PM 05/02/2021    9:46 AM 05/01/2021    4:46 PM  Fall Risk   Falls in the past year? 0 0 0 0 0  Number falls in past yr: 0 0 0 0   Injury with Fall? 0 0 0 0   Risk for fall due to : No Fall  Risks No Fall Risks No Fall Risks    Follow up Falls evaluation completed Falls prevention discussed  Falls evaluation completed       Data saved with a previous flowsheet row definition    MEDICARE RISK AT HOME:  Medicare Risk at Home Any stairs in or around the home?: Yes If so, are there any without handrails?: No Home free of loose throw rugs in walkways, pet beds, electrical cords, etc?: Yes Adequate lighting in your home to reduce risk of falls?: Yes Life alert?: No Use of a cane, walker or w/c?: No Grab bars in the bathroom?: Yes Shower chair or bench in shower?: No Elevated toilet seat or a handicapped toilet?: No  TIMED UP AND GO:  Was the test performed?  Yes   Length of time to ambulate 10 feet: 10  sec Gait steady and fast without use of assistive device  Cognitive Function: 6CIT completed    02/10/2018    2:36 PM 02/06/2017    3:27 PM 02/02/2016    1:55 PM  MMSE - Mini Mental State Exam  Not completed: -- -- --        10/13/2023    1:21 PM 05/15/2022   12:54 PM 05/10/2021    1:58 PM  6CIT Screen  What Year? 0 points 0 points 0 points  What month? 0 points 0 points 0 points  What time? 0 points 0 points 0 points  Count back from 20 0 points 0 points 0 points  Months in reverse 0 points 0 points 0 points  Repeat phrase 0 points 0 points 0 points  Total Score 0 points 0 points 0 points    Immunizations Immunization History  Administered Date(s) Administered   Fluad Quad(high Dose 65+) 02/03/2019   Influenza, High Dose Seasonal PF 02/06/2016, 02/06/2017, 02/10/2018   Influenza-Unspecified 01/30/2021, 12/06/2021   PFIZER(Purple Top)SARS-COV-2 Vaccination 05/14/2019, 06/05/2019, 08/30/2020   Pneumococcal Conjugate-13 11/27/2015   Pneumococcal Polysaccharide-23 04/29/2017   Tdap 03/09/2018    Screening Tests Health Maintenance  Topic Date Due   Zoster Vaccines- Shingrix (1 of 2) Never done   COVID-19 Vaccine (4 - 2024-25 season) 12/01/2022   INFLUENZA VACCINE  10/31/2023   Medicare Annual Wellness (AWV)  10/12/2024   DTaP/Tdap/Td (2 - Td or Tdap) 03/09/2028   Pneumococcal Vaccine: 50+ Years  Completed   DEXA SCAN  Completed   Hepatitis B Vaccines  Aged Out   HPV VACCINES  Aged Out   Meningococcal B Vaccine  Aged Out   Colonoscopy  Discontinued    Health Maintenance  Health Maintenance Due  Topic Date Due   Zoster Vaccines- Shingrix (1 of 2) Never done   COVID-19 Vaccine (4 - 2024-25 season) 12/01/2022   Health Maintenance Items Addressed:   Additional Screening:  Vision Screening: Recommended annual ophthalmology exams for early detection of glaucoma and other disorders of the eye. Would you like a referral to  an eye doctor? No    Dental Screening: Recommended annual dental exams for proper oral hygiene  Community Resource Referral / Chronic Care Management: CRR required this visit?  No   CCM required this visit?  No   Plan:    I have personally reviewed and noted the following in the patient's chart:   Medical and social history Use of alcohol, tobacco or illicit drugs  Current medications and supplements including opioid prescriptions. Patient is not currently taking opioid prescriptions. Functional ability and status Nutritional status Physical activity Advanced directives List of other  physicians Hospitalizations, surgeries, and ER visits in previous 12 months Vitals Screenings to include cognitive, depression, and falls Referrals and appointments  In addition, I have reviewed and discussed with patient certain preventive protocols, quality metrics, and best practice recommendations. A written personalized care plan for preventive services as well as general preventive health recommendations were provided to patient.   Rojelio LELON Blush, LPN   [id number]/[id number]   After Visit Summary: (In Person-Printed) AVS printed and given to the patient  Notes: Nothing significant to report at this time.

## 2023-10-13 NOTE — Patient Instructions (Addendum)
 Ms. Borden , Thank you for taking time out of your busy schedule to complete your Annual Wellness Visit with me. I enjoyed our conversation and look forward to speaking with you again next year. I, as well as your care team,  appreciate your ongoing commitment to your health goals. Please review the following plan we discussed and let me know if I can assist you in the future. Your Game plan/ To Do List    Referrals: If you haven't heard from the office you've been referred to, please reach out to them at the phone provided.    Follow up Visits: Next Medicare AWV with our clinical staff: 10/18/24 @ 1p   Have you seen your provider in the last 6 months (3 months if uncontrolled diabetes)? 11/03/22 Next Office Visit with your provider:   Clinician Recommendations:  Aim for 30 minutes of exercise or brisk walking, 6-8 glasses of water, and 5 servings of fruits and vegetables each day.        This is a list of the screening recommended for you and due dates:  Health Maintenance  Topic Date Due   Zoster (Shingles) Vaccine (1 of 2) Never done   COVID-19 Vaccine (4 - 2024-25 season) 12/01/2022   Flu Shot  10/31/2023   Medicare Annual Wellness Visit  10/12/2024   DTaP/Tdap/Td vaccine (2 - Td or Tdap) 03/09/2028   Pneumococcal Vaccine for age over 19  Completed   DEXA scan (bone density measurement)  Completed   Hepatitis B Vaccine  Aged Out   HPV Vaccine  Aged Out   Meningitis B Vaccine  Aged Out   Colon Cancer Screening  Discontinued    Advanced directives: (Copy Requested) Please bring a copy of your health care power of attorney and living will to the office to be added to your chart at your convenience. You can mail to [facility] [address] [place], [place] [address] or email to [email or web address] Advance Care Planning is important because it:  [x]  Makes sure you receive the medical care that is consistent with your values, goals, and preferences  [x]  It  provides guidance to your family and loved ones and reduces their decisional burden about whether or not they are making the right decisions based on your wishes.  Follow the link provided in your after visit summary or read over the paperwork we have mailed to you to help you started getting your Advance Directives in place. If you need assistance in completing these, please reach out to us  so that we can help you!  See attachments for Preventive Care and Fall Prevention Tips.

## 2024-10-18 ENCOUNTER — Ambulatory Visit
